# Patient Record
Sex: Male | Born: 1975 | Race: White | Hispanic: No | Smoking: Current some day smoker
Health system: Southern US, Community
[De-identification: ages and names within clinical notes are randomized; demographics above are authoritative.]

## PROBLEM LIST (undated history)

## (undated) DIAGNOSIS — F988 Other specified behavioral and emotional disorders with onset usually occurring in childhood and adolescence: Secondary | ICD-10-CM

## (undated) DIAGNOSIS — J45909 Unspecified asthma, uncomplicated: Secondary | ICD-10-CM

## (undated) HISTORY — DX: Other specified behavioral and emotional disorders with onset usually occurring in childhood and adolescence: F98.8

## (undated) HISTORY — DX: Unspecified asthma, uncomplicated: J45.909

---

## 2015-12-18 ENCOUNTER — Encounter (HOSPITAL_COMMUNITY): Payer: Self-pay | Admitting: Radiology

## 2015-12-18 ENCOUNTER — Emergency Department (HOSPITAL_COMMUNITY): Payer: Medicaid Other

## 2015-12-18 ENCOUNTER — Inpatient Hospital Stay (HOSPITAL_COMMUNITY)
Admission: EM | Admit: 2015-12-18 | Discharge: 2015-12-23 | DRG: 493 | Disposition: A | Discharge status: Rehabilitation Facility | Payer: Medicaid Other | Attending: Orthopedic Surgery | Admitting: Orthopedic Surgery

## 2015-12-18 DIAGNOSIS — S0101XA Laceration without foreign body of scalp, initial encounter: Secondary | ICD-10-CM | POA: Diagnosis present

## 2015-12-18 DIAGNOSIS — S0240DA Maxillary fracture, left side, initial encounter for closed fracture: Secondary | ICD-10-CM | POA: Diagnosis present

## 2015-12-18 DIAGNOSIS — D696 Thrombocytopenia, unspecified: Secondary | ICD-10-CM | POA: Diagnosis not present

## 2015-12-18 DIAGNOSIS — S82891B Other fracture of right lower leg, initial encounter for open fracture type I or II: Secondary | ICD-10-CM | POA: Diagnosis present

## 2015-12-18 DIAGNOSIS — S329XXA Fracture of unspecified parts of lumbosacral spine and pelvis, initial encounter for closed fracture: Secondary | ICD-10-CM | POA: Diagnosis present

## 2015-12-18 DIAGNOSIS — R339 Retention of urine, unspecified: Secondary | ICD-10-CM | POA: Diagnosis not present

## 2015-12-18 DIAGNOSIS — R062 Wheezing: Secondary | ICD-10-CM | POA: Diagnosis not present

## 2015-12-18 DIAGNOSIS — S82841B Displaced bimalleolar fracture of right lower leg, initial encounter for open fracture type I or II: Principal | ICD-10-CM

## 2015-12-18 DIAGNOSIS — S3219XA Other fracture of sacrum, initial encounter for closed fracture: Secondary | ICD-10-CM | POA: Diagnosis present

## 2015-12-18 DIAGNOSIS — M6281 Muscle weakness (generalized): Secondary | ICD-10-CM

## 2015-12-18 DIAGNOSIS — Z419 Encounter for procedure for purposes other than remedying health state, unspecified: Secondary | ICD-10-CM

## 2015-12-18 DIAGNOSIS — E876 Hypokalemia: Secondary | ICD-10-CM | POA: Diagnosis present

## 2015-12-18 DIAGNOSIS — R000 Tachycardia, unspecified: Secondary | ICD-10-CM | POA: Diagnosis present

## 2015-12-18 DIAGNOSIS — S32811A Multiple fractures of pelvis with unstable disruption of pelvic ring, initial encounter for closed fracture: Secondary | ICD-10-CM | POA: Diagnosis present

## 2015-12-18 DIAGNOSIS — E871 Hypo-osmolality and hyponatremia: Secondary | ICD-10-CM | POA: Diagnosis present

## 2015-12-18 DIAGNOSIS — S2242XA Multiple fractures of ribs, left side, initial encounter for closed fracture: Secondary | ICD-10-CM | POA: Diagnosis present

## 2015-12-18 DIAGNOSIS — F17200 Nicotine dependence, unspecified, uncomplicated: Secondary | ICD-10-CM | POA: Diagnosis present

## 2015-12-18 DIAGNOSIS — K5900 Constipation, unspecified: Secondary | ICD-10-CM | POA: Diagnosis not present

## 2015-12-18 DIAGNOSIS — Z720 Tobacco use: Secondary | ICD-10-CM | POA: Diagnosis present

## 2015-12-18 DIAGNOSIS — T1490XA Injury, unspecified, initial encounter: Secondary | ICD-10-CM

## 2015-12-18 DIAGNOSIS — D72829 Elevated white blood cell count, unspecified: Secondary | ICD-10-CM | POA: Diagnosis present

## 2015-12-18 DIAGNOSIS — R52 Pain, unspecified: Secondary | ICD-10-CM

## 2015-12-18 DIAGNOSIS — S32810A Multiple fractures of pelvis with stable disruption of pelvic ring, initial encounter for closed fracture: Secondary | ICD-10-CM

## 2015-12-18 DIAGNOSIS — D62 Acute posthemorrhagic anemia: Secondary | ICD-10-CM | POA: Diagnosis not present

## 2015-12-18 LAB — COMPREHENSIVE METABOLIC PANEL
ALK PHOS: 55 U/L (ref 38–126)
ALT: 38 U/L (ref 17–63)
ANION GAP: 6 (ref 5–15)
AST: 36 U/L (ref 15–41)
Albumin: 3.1 g/dL — ABNORMAL LOW (ref 3.5–5.0)
BILIRUBIN TOTAL: 0.4 mg/dL (ref 0.3–1.2)
BUN: 11 mg/dL (ref 6–20)
CALCIUM: 7.8 mg/dL — AB (ref 8.9–10.3)
CO2: 22 mmol/L (ref 22–32)
Chloride: 111 mmol/L (ref 101–111)
Creatinine, Ser: 0.89 mg/dL (ref 0.61–1.24)
Glucose, Bld: 97 mg/dL (ref 65–99)
POTASSIUM: 3.5 mmol/L (ref 3.5–5.1)
Sodium: 139 mmol/L (ref 135–145)
TOTAL PROTEIN: 5.1 g/dL — AB (ref 6.5–8.1)

## 2015-12-18 LAB — CBC
HCT: 36.3 % — ABNORMAL LOW (ref 39.0–52.0)
HEMOGLOBIN: 12.1 g/dL — AB (ref 13.0–17.0)
MCH: 30.7 pg (ref 26.0–34.0)
MCHC: 33.3 g/dL (ref 30.0–36.0)
MCV: 92.1 fL (ref 78.0–100.0)
Platelets: 247 10*3/uL (ref 150–400)
RBC: 3.94 MIL/uL — AB (ref 4.22–5.81)
RDW: 13.3 % (ref 11.5–15.5)
WBC: 25.6 10*3/uL — AB (ref 4.0–10.5)

## 2015-12-18 LAB — CDS SEROLOGY

## 2015-12-18 LAB — I-STAT CHEM 8, ED
BUN: 11 mg/dL (ref 6–20)
CALCIUM ION: 1.11 mmol/L — AB (ref 1.15–1.40)
CHLORIDE: 107 mmol/L (ref 101–111)
Creatinine, Ser: 1 mg/dL (ref 0.61–1.24)
GLUCOSE: 96 mg/dL (ref 65–99)
HCT: 35 % — ABNORMAL LOW (ref 39.0–52.0)
Hemoglobin: 11.9 g/dL — ABNORMAL LOW (ref 13.0–17.0)
Potassium: 3.3 mmol/L — ABNORMAL LOW (ref 3.5–5.1)
Sodium: 143 mmol/L (ref 135–145)
TCO2: 23 mmol/L (ref 0–100)

## 2015-12-18 LAB — PREPARE FRESH FROZEN PLASMA
UNIT DIVISION: 0
UNIT DIVISION: 0

## 2015-12-18 LAB — PROTIME-INR
INR: 1.1
PROTHROMBIN TIME: 14.3 s (ref 11.4–15.2)

## 2015-12-18 LAB — ETHANOL

## 2015-12-18 LAB — I-STAT CG4 LACTIC ACID, ED: LACTIC ACID, VENOUS: 2.1 mmol/L — AB (ref 0.5–1.9)

## 2015-12-18 LAB — ABO/RH: ABO/RH(D): A POS

## 2015-12-18 MED ORDER — HYDROMORPHONE HCL 1 MG/ML IJ SOLN
1.0000 mg | Freq: Once | INTRAMUSCULAR | Status: AC
  Administered 2015-12-18: 1 mg via INTRAVENOUS
  Filled 2015-12-18: qty 1

## 2015-12-18 MED ORDER — FENTANYL CITRATE (PF) 100 MCG/2ML IJ SOLN
INTRAMUSCULAR | Status: AC
  Filled 2015-12-18: qty 2

## 2015-12-18 MED ORDER — IOPAMIDOL (ISOVUE-300) INJECTION 61%
100.0000 mL | Freq: Once | INTRAVENOUS | Status: AC | PRN
  Administered 2015-12-18: 100 mL via INTRAVENOUS

## 2015-12-18 MED ORDER — DEXTROSE 5 % IV SOLN
INTRAVENOUS | Status: AC | PRN
  Administered 2015-12-18: 2000 mg via INTRAVENOUS

## 2015-12-18 MED ORDER — KETAMINE HCL 10 MG/ML IJ SOLN
0.5000 mg/kg | Freq: Once | INTRAMUSCULAR | Status: DC
  Administered 2015-12-18: 50 mg via INTRAVENOUS

## 2015-12-18 MED ORDER — TETANUS-DIPHTH-ACELL PERTUSSIS 5-2.5-18.5 LF-MCG/0.5 IM SUSP
0.5000 mL | Freq: Once | INTRAMUSCULAR | Status: AC
  Administered 2015-12-18: 0.5 mL via INTRAMUSCULAR

## 2015-12-18 MED ORDER — FENTANYL CITRATE (PF) 100 MCG/2ML IJ SOLN
100.0000 ug | Freq: Once | INTRAMUSCULAR | Status: AC
  Administered 2015-12-18: 100 ug via INTRAVENOUS

## 2015-12-18 MED ORDER — HYDROMORPHONE HCL 1 MG/ML IJ SOLN: 1.0000 mg | Freq: Once | INTRAMUSCULAR | Status: DC

## 2015-12-18 MED ORDER — TETANUS-DIPHTH-ACELL PERTUSSIS 5-2.5-18.5 LF-MCG/0.5 IM SUSP
INTRAMUSCULAR | Status: AC
  Filled 2015-12-18: qty 0.5

## 2015-12-18 MED ORDER — SODIUM CHLORIDE 0.9 % IV SOLN
INTRAVENOUS | Status: AC | PRN
  Administered 2015-12-18 (×3): 1000 mL via INTRAVENOUS

## 2015-12-18 MED ORDER — CEFAZOLIN SODIUM-DEXTROSE 2-4 GM/100ML-% IV SOLN
INTRAVENOUS | Status: AC
  Filled 2015-12-18: qty 100

## 2015-12-18 MED ORDER — ONDANSETRON HCL 4 MG/2ML IJ SOLN
4.0000 mg | Freq: Once | INTRAMUSCULAR | Status: AC
  Administered 2015-12-18: 4 mg via INTRAVENOUS
  Filled 2015-12-18: qty 2

## 2015-12-18 MED ORDER — FENTANYL CITRATE (PF) 100 MCG/2ML IJ SOLN
INTRAMUSCULAR | Status: AC | PRN
  Administered 2015-12-18: 100 ug via INTRAVENOUS

## 2015-12-18 MED ORDER — CEFAZOLIN SODIUM-DEXTROSE 2-4 GM/100ML-% IV SOLN
2.0000 g | Freq: Once | INTRAVENOUS | Status: AC
  Administered 2015-12-20: 2 g via INTRAVENOUS

## 2015-12-18 MED ORDER — KETAMINE HCL-SODIUM CHLORIDE 100-0.9 MG/10ML-% IV SOSY
PREFILLED_SYRINGE | INTRAVENOUS | Status: AC
  Filled 2015-12-18: qty 10

## 2015-12-18 NOTE — Progress Notes (Signed)
Orthopedic Tech Progress Note Patient Details:  Allyson SabalDavid Housey 1976/01/16 [id number]  Ortho Devices Type of Ortho Device: Ace wrap, Post (short leg) splint, Stirrup splint Ortho Device/Splint Location: RLE Ortho Device/Splint Interventions: Ordered, Application   [name], Brinkley Peet Craig 12/18/2015, 9:51 PM

## 2015-12-18 NOTE — Progress Notes (Signed)
Orthopedic Tech Progress Note Patient Details:  Lance SabalDavid Rivera July 10, 1975 [id number] Level 1 trauma ortho visit. Patient ID: Lance Rivera, male   DOB: July 10, 1975, 40 y.o.   MRN: [id number]   Lance Rivera, Lance Rivera 12/18/2015, 8:02 PM
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
## 2015-12-18 NOTE — ED Notes (Signed)
Ancef complete
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
## 2015-12-19 ENCOUNTER — Encounter (HOSPITAL_COMMUNITY): Payer: Self-pay

## 2015-12-19 ENCOUNTER — Other Ambulatory Visit (HOSPITAL_COMMUNITY): Payer: Self-pay | Admitting: Family

## 2015-12-19 DIAGNOSIS — S3219XA Other fracture of sacrum, initial encounter for closed fracture: Secondary | ICD-10-CM | POA: Diagnosis present

## 2015-12-19 DIAGNOSIS — F17200 Nicotine dependence, unspecified, uncomplicated: Secondary | ICD-10-CM | POA: Diagnosis present

## 2015-12-19 DIAGNOSIS — S32811D Multiple fractures of pelvis with unstable disruption of pelvic ring, subsequent encounter for fracture with routine healing: Secondary | ICD-10-CM | POA: Diagnosis not present

## 2015-12-19 DIAGNOSIS — D72829 Elevated white blood cell count, unspecified: Secondary | ICD-10-CM | POA: Diagnosis present

## 2015-12-19 DIAGNOSIS — S82891B Other fracture of right lower leg, initial encounter for open fracture type I or II: Secondary | ICD-10-CM | POA: Diagnosis present

## 2015-12-19 DIAGNOSIS — S82892D Other fracture of left lower leg, subsequent encounter for closed fracture with routine healing: Secondary | ICD-10-CM | POA: Diagnosis not present

## 2015-12-19 DIAGNOSIS — S82892S Other fracture of left lower leg, sequela: Secondary | ICD-10-CM | POA: Diagnosis not present

## 2015-12-19 DIAGNOSIS — R339 Retention of urine, unspecified: Secondary | ICD-10-CM | POA: Diagnosis not present

## 2015-12-19 DIAGNOSIS — S2242XA Multiple fractures of ribs, left side, initial encounter for closed fracture: Secondary | ICD-10-CM | POA: Diagnosis present

## 2015-12-19 DIAGNOSIS — E871 Hypo-osmolality and hyponatremia: Secondary | ICD-10-CM | POA: Diagnosis present

## 2015-12-19 DIAGNOSIS — S329XXA Fracture of unspecified parts of lumbosacral spine and pelvis, initial encounter for closed fracture: Secondary | ICD-10-CM | POA: Diagnosis present

## 2015-12-19 DIAGNOSIS — K5900 Constipation, unspecified: Secondary | ICD-10-CM | POA: Diagnosis not present

## 2015-12-19 DIAGNOSIS — S82891S Other fracture of right lower leg, sequela: Secondary | ICD-10-CM | POA: Diagnosis not present

## 2015-12-19 DIAGNOSIS — R062 Wheezing: Secondary | ICD-10-CM | POA: Diagnosis not present

## 2015-12-19 DIAGNOSIS — S32811A Multiple fractures of pelvis with unstable disruption of pelvic ring, initial encounter for closed fracture: Secondary | ICD-10-CM | POA: Diagnosis present

## 2015-12-19 DIAGNOSIS — S82891D Other fracture of right lower leg, subsequent encounter for closed fracture with routine healing: Secondary | ICD-10-CM | POA: Diagnosis not present

## 2015-12-19 DIAGNOSIS — D62 Acute posthemorrhagic anemia: Secondary | ICD-10-CM | POA: Diagnosis not present

## 2015-12-19 DIAGNOSIS — S32810S Multiple fractures of pelvis with stable disruption of pelvic ring, sequela: Secondary | ICD-10-CM | POA: Diagnosis not present

## 2015-12-19 DIAGNOSIS — S32810D Multiple fractures of pelvis with stable disruption of pelvic ring, subsequent encounter for fracture with routine healing: Secondary | ICD-10-CM | POA: Diagnosis not present

## 2015-12-19 DIAGNOSIS — S0101XA Laceration without foreign body of scalp, initial encounter: Secondary | ICD-10-CM | POA: Diagnosis present

## 2015-12-19 DIAGNOSIS — S32810A Multiple fractures of pelvis with stable disruption of pelvic ring, initial encounter for closed fracture: Secondary | ICD-10-CM | POA: Diagnosis not present

## 2015-12-19 DIAGNOSIS — M25571 Pain in right ankle and joints of right foot: Secondary | ICD-10-CM | POA: Diagnosis present

## 2015-12-19 DIAGNOSIS — E876 Hypokalemia: Secondary | ICD-10-CM | POA: Diagnosis present

## 2015-12-19 DIAGNOSIS — Z419 Encounter for procedure for purposes other than remedying health state, unspecified: Secondary | ICD-10-CM | POA: Diagnosis not present

## 2015-12-19 DIAGNOSIS — S82841B Displaced bimalleolar fracture of right lower leg, initial encounter for open fracture type I or II: Secondary | ICD-10-CM | POA: Diagnosis present

## 2015-12-19 DIAGNOSIS — S0240DA Maxillary fracture, left side, initial encounter for closed fracture: Secondary | ICD-10-CM | POA: Diagnosis present

## 2015-12-19 LAB — BASIC METABOLIC PANEL
Anion gap: 5 (ref 5–15)
BUN: 13 mg/dL (ref 6–20)
CALCIUM: 7.9 mg/dL — AB (ref 8.9–10.3)
CO2: 24 mmol/L (ref 22–32)
CREATININE: 1.06 mg/dL (ref 0.61–1.24)
Chloride: 112 mmol/L — ABNORMAL HIGH (ref 101–111)
Glucose, Bld: 149 mg/dL — ABNORMAL HIGH (ref 65–99)
Potassium: 4.1 mmol/L (ref 3.5–5.1)
SODIUM: 141 mmol/L (ref 135–145)

## 2015-12-19 LAB — CBC
HCT: 33.8 % — ABNORMAL LOW (ref 39.0–52.0)
HEMOGLOBIN: 10.9 g/dL — AB (ref 13.0–17.0)
MCH: 29.9 pg (ref 26.0–34.0)
MCHC: 32.2 g/dL (ref 30.0–36.0)
MCV: 92.9 fL (ref 78.0–100.0)
Platelets: 217 10*3/uL (ref 150–400)
RBC: 3.64 MIL/uL — ABNORMAL LOW (ref 4.22–5.81)
RDW: 13.3 % (ref 11.5–15.5)
WBC: 14.4 10*3/uL — ABNORMAL HIGH (ref 4.0–10.5)

## 2015-12-19 LAB — URINALYSIS, ROUTINE W REFLEX MICROSCOPIC
Bilirubin Urine: NEGATIVE
GLUCOSE, UA: NEGATIVE mg/dL
HGB URINE DIPSTICK: NEGATIVE
Ketones, ur: NEGATIVE mg/dL
LEUKOCYTES UA: NEGATIVE
Nitrite: NEGATIVE
PH: 5 (ref 5.0–8.0)
PROTEIN: NEGATIVE mg/dL
SPECIFIC GRAVITY, URINE: 1.02 (ref 1.005–1.030)

## 2015-12-19 LAB — BLOOD PRODUCT ORDER (VERBAL) VERIFICATION

## 2015-12-19 LAB — MRSA PCR SCREENING: MRSA BY PCR: POSITIVE — AB

## 2015-12-19 LAB — GLUCOSE, CAPILLARY: Glucose-Capillary: 145 mg/dL — ABNORMAL HIGH (ref 65–99)

## 2015-12-19 MED ORDER — PNEUMOCOCCAL VAC POLYVALENT 25 MCG/0.5ML IJ INJ: 0.5000 mL | INJECTION | INTRAMUSCULAR | Status: DC

## 2015-12-19 MED ORDER — CEFAZOLIN IN D5W 1 GM/50ML IV SOLN
1.0000 g | Freq: Three times a day (TID) | INTRAVENOUS | Status: DC
Start: 2015-12-19 — End: 2015-12-22
  Administered 2015-12-19 – 2015-12-22 (×9): 1 g via INTRAVENOUS
  Filled 2015-12-19 (×14): qty 50

## 2015-12-19 MED ORDER — HYDROMORPHONE 1 MG/ML IV SOLN
INTRAVENOUS | Status: DC
  Administered 2015-12-19: 3.2 mg via INTRAVENOUS
  Administered 2015-12-19: 13:00:00 via INTRAVENOUS
  Administered 2015-12-19: 1.5 mg via INTRAVENOUS
  Administered 2015-12-19: 2.4 mg via INTRAVENOUS
  Administered 2015-12-20: 0.6 mg via INTRAVENOUS
  Administered 2015-12-20: 2.4 mg via INTRAVENOUS
  Administered 2015-12-20: 2.1 mg via INTRAVENOUS
  Administered 2015-12-20: 3.3 mg via INTRAVENOUS
  Administered 2015-12-21: 1.2 mg via INTRAVENOUS
  Administered 2015-12-21: 0.3 mg via INTRAVENOUS
  Administered 2015-12-21: 3.3 mg via INTRAVENOUS
  Administered 2015-12-21: 4.5 mg via INTRAVENOUS
  Administered 2015-12-21: 11:00:00 via INTRAVENOUS
  Administered 2015-12-21: 1.8 mg via INTRAVENOUS
  Administered 2015-12-21 – 2015-12-22 (×3): 3 mg via INTRAVENOUS
  Administered 2015-12-22: 10 mg via INTRAVENOUS
  Filled 2015-12-19 (×2): qty 25

## 2015-12-19 MED ORDER — SODIUM CHLORIDE 0.9% FLUSH: 9.0000 mL | INTRAVENOUS | Status: DC | PRN

## 2015-12-19 MED ORDER — KCL IN DEXTROSE-NACL 20-5-0.45 MEQ/L-%-% IV SOLN
INTRAVENOUS | Status: DC
  Administered 2015-12-19 – 2015-12-20 (×2): via INTRAVENOUS
  Filled 2015-12-19 (×6): qty 1000

## 2015-12-19 MED ORDER — DOCUSATE SODIUM 100 MG PO CAPS
100.0000 mg | ORAL_CAPSULE | Freq: Two times a day (BID) | ORAL | Status: DC
  Administered 2015-12-19 – 2015-12-23 (×9): 100 mg via ORAL
  Filled 2015-12-19 (×9): qty 1

## 2015-12-19 MED ORDER — ACETAMINOPHEN 325 MG PO TABS
650.0000 mg | ORAL_TABLET | ORAL | Status: DC | PRN
  Administered 2015-12-19 – 2015-12-21 (×2): 650 mg via ORAL
  Filled 2015-12-19 (×2): qty 2

## 2015-12-19 MED ORDER — MORPHINE SULFATE (PF) 2 MG/ML IV SOLN
2.0000 mg | INTRAVENOUS | Status: DC | PRN
  Administered 2015-12-19 (×5): 4 mg via INTRAVENOUS
  Administered 2015-12-19: 2 mg via INTRAVENOUS
  Administered 2015-12-19: 4 mg via INTRAVENOUS
  Filled 2015-12-19: qty 2
  Filled 2015-12-19: qty 1
  Filled 2015-12-19 (×5): qty 2

## 2015-12-19 MED ORDER — NALOXONE HCL 0.4 MG/ML IJ SOLN: 0.4000 mg | INTRAMUSCULAR | Status: DC | PRN

## 2015-12-19 MED ORDER — DIPHENHYDRAMINE HCL 50 MG/ML IJ SOLN
12.5000 mg | Freq: Four times a day (QID) | INTRAMUSCULAR | Status: DC | PRN
  Administered 2015-12-19 – 2015-12-20 (×4): 12.5 mg via INTRAVENOUS
  Filled 2015-12-19 (×4): qty 1

## 2015-12-19 MED ORDER — ONDANSETRON HCL 4 MG/2ML IJ SOLN: 4.0000 mg | Freq: Four times a day (QID) | INTRAMUSCULAR | Status: DC | PRN

## 2015-12-19 MED ORDER — MUPIROCIN 2 % EX OINT
1.0000 "application " | TOPICAL_OINTMENT | Freq: Two times a day (BID) | CUTANEOUS | Status: DC
  Administered 2015-12-19 – 2015-12-23 (×9): 1 via NASAL
  Filled 2015-12-19 (×2): qty 22

## 2015-12-19 MED ORDER — DIPHENHYDRAMINE HCL 12.5 MG/5ML PO ELIX: 12.5000 mg | ORAL_SOLUTION | Freq: Four times a day (QID) | ORAL | Status: DC | PRN

## 2015-12-19 MED ORDER — ONDANSETRON HCL 4 MG PO TABS: 4.0000 mg | ORAL_TABLET | Freq: Four times a day (QID) | ORAL | Status: DC | PRN

## 2015-12-19 MED ORDER — CHLORHEXIDINE GLUCONATE CLOTH 2 % EX PADS
6.0000 | MEDICATED_PAD | Freq: Every day | CUTANEOUS | Status: AC
  Administered 2015-12-20 – 2015-12-23 (×4): 6 via TOPICAL

## 2015-12-19 MED ORDER — ORAL CARE MOUTH RINSE
15.0000 mL | Freq: Two times a day (BID) | OROMUCOSAL | Status: DC
  Administered 2015-12-19 – 2015-12-22 (×7): 15 mL via OROMUCOSAL

## 2015-12-19 MED ORDER — IPRATROPIUM-ALBUTEROL 0.5-2.5 (3) MG/3ML IN SOLN
3.0000 mL | Freq: Four times a day (QID) | RESPIRATORY_TRACT | Status: DC
  Administered 2015-12-19 – 2015-12-20 (×5): 3 mL via RESPIRATORY_TRACT
  Filled 2015-12-19 (×5): qty 3

## 2015-12-19 NOTE — Progress Notes (Signed)
Family advised that unit is not responsible for valuables left in the room, family understands risk of leaving cell phone.
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
## 2015-12-19 NOTE — Progress Notes (Signed)
Patient ID: Lance BurnsAllyson Rivera, male   DOB: 24-Mar-1976, 40 y.o.   MRN: [id number] Patient alert and oriented this morning without new complaints. Plan for open reduction internal fixation right ankle fracture on Tuesday. Orders written to continue IV Kefzol.

## 2015-12-20 ENCOUNTER — Inpatient Hospital Stay (HOSPITAL_COMMUNITY): Payer: Medicaid Other

## 2015-12-20 ENCOUNTER — Inpatient Hospital Stay (HOSPITAL_COMMUNITY): Payer: Medicaid Other | Admitting: Certified Registered"

## 2015-12-20 ENCOUNTER — Encounter (HOSPITAL_COMMUNITY): Admission: EM | Disposition: A | Discharge status: Rehabilitation Facility | Payer: Self-pay | Source: Home / Self Care

## 2015-12-20 HISTORY — PX: SACRO-ILIAC PINNING: SHX5050

## 2015-12-20 HISTORY — PX: ORIF ANKLE FRACTURE: SHX5408

## 2015-12-20 SURGERY — OPEN REDUCTION INTERNAL FIXATION (ORIF) ANKLE FRACTURE
Anesthesia: Regional | Site: Pelvis | Laterality: Right

## 2015-12-20 MED ORDER — HYDROMORPHONE HCL 1 MG/ML IJ SOLN: 0.2500 mg | INTRAMUSCULAR | Status: DC | PRN

## 2015-12-20 MED ORDER — ROCURONIUM BROMIDE 10 MG/ML (PF) SYRINGE
PREFILLED_SYRINGE | INTRAVENOUS | Status: AC
  Filled 2015-12-20: qty 10

## 2015-12-20 MED ORDER — FENTANYL CITRATE (PF) 100 MCG/2ML IJ SOLN
INTRAMUSCULAR | Status: AC
  Filled 2015-12-20: qty 2

## 2015-12-20 MED ORDER — MIDAZOLAM HCL 5 MG/5ML IJ SOLN
INTRAMUSCULAR | Status: DC | PRN
  Administered 2015-12-20: 2 mg via INTRAVENOUS

## 2015-12-20 MED ORDER — MIDAZOLAM HCL 2 MG/2ML IJ SOLN
INTRAMUSCULAR | Status: AC
  Filled 2015-12-20: qty 2

## 2015-12-20 MED ORDER — LIDOCAINE 2% (20 MG/ML) 5 ML SYRINGE
INTRAMUSCULAR | Status: AC
  Filled 2015-12-20: qty 5

## 2015-12-20 MED ORDER — 0.9 % SODIUM CHLORIDE (POUR BTL) OPTIME
TOPICAL | Status: DC | PRN
  Administered 2015-12-20: 1000 mL

## 2015-12-20 MED ORDER — PNEUMOCOCCAL VAC POLYVALENT 25 MCG/0.5ML IJ INJ
0.5000 mL | INJECTION | INTRAMUSCULAR | Status: AC
  Administered 2015-12-21: 0.5 mL via INTRAMUSCULAR
  Filled 2015-12-20: qty 0.5

## 2015-12-20 MED ORDER — FENTANYL CITRATE (PF) 100 MCG/2ML IJ SOLN
INTRAMUSCULAR | Status: DC | PRN
  Administered 2015-12-20 (×4): 50 ug via INTRAVENOUS

## 2015-12-20 MED ORDER — SUGAMMADEX SODIUM 200 MG/2ML IV SOLN
INTRAVENOUS | Status: AC
  Filled 2015-12-20: qty 2

## 2015-12-20 MED ORDER — LACTATED RINGERS IV SOLN
INTRAVENOUS | Status: DC
  Administered 2015-12-20 (×3): via INTRAVENOUS

## 2015-12-20 MED ORDER — CEFAZOLIN SODIUM-DEXTROSE 2-4 GM/100ML-% IV SOLN
2.0000 g | INTRAVENOUS | Status: DC
  Filled 2015-12-20 (×2): qty 100

## 2015-12-20 MED ORDER — LIDOCAINE HCL (CARDIAC) 20 MG/ML IV SOLN
INTRAVENOUS | Status: DC | PRN
  Administered 2015-12-20: 100 mg via INTRAVENOUS

## 2015-12-20 MED ORDER — PROPOFOL 10 MG/ML IV BOLUS
INTRAVENOUS | Status: DC | PRN
  Administered 2015-12-20: 150 mg via INTRAVENOUS

## 2015-12-20 MED ORDER — IPRATROPIUM-ALBUTEROL 0.5-2.5 (3) MG/3ML IN SOLN: 3.0000 mL | Freq: Four times a day (QID) | RESPIRATORY_TRACT | Status: DC | PRN

## 2015-12-20 MED ORDER — ONDANSETRON HCL 4 MG/2ML IJ SOLN
INTRAMUSCULAR | Status: AC
  Filled 2015-12-20: qty 2

## 2015-12-20 MED ORDER — PROMETHAZINE HCL 25 MG/ML IJ SOLN: 6.2500 mg | INTRAMUSCULAR | Status: DC | PRN

## 2015-12-20 MED ORDER — ROCURONIUM BROMIDE 100 MG/10ML IV SOLN
INTRAVENOUS | Status: DC | PRN
  Administered 2015-12-20: 20 mg via INTRAVENOUS
  Administered 2015-12-20: 10 mg via INTRAVENOUS
  Administered 2015-12-20: 50 mg via INTRAVENOUS
  Administered 2015-12-20 (×2): 20 mg via INTRAVENOUS

## 2015-12-20 MED ORDER — ONDANSETRON HCL 4 MG/2ML IJ SOLN
INTRAMUSCULAR | Status: DC | PRN
  Administered 2015-12-20: 4 mg via INTRAVENOUS

## 2015-12-20 MED ORDER — SUGAMMADEX SODIUM 200 MG/2ML IV SOLN
INTRAVENOUS | Status: DC | PRN
  Administered 2015-12-20: 200 mg via INTRAVENOUS

## 2015-12-20 MED ORDER — CHLORHEXIDINE GLUCONATE 4 % EX LIQD
60.0000 mL | Freq: Once | CUTANEOUS | Status: AC
  Administered 2015-12-20: 4 via TOPICAL
  Filled 2015-12-20: qty 60

## 2015-12-20 SURGICAL SUPPLY — 47 items
2 screw short thread ×8 IMPLANT
BANDAGE ESMARK 6X9 LF (GAUZE/BANDAGES/DRESSINGS) ×2 IMPLANT
BIT DRILL LCP QC 2X140 (BIT) ×4 IMPLANT
BNDG COHESIVE 4X5 TAN STRL (GAUZE/BANDAGES/DRESSINGS) ×4 IMPLANT
BNDG ESMARK 6X9 LF (GAUZE/BANDAGES/DRESSINGS) ×4
BNDG GAUZE ELAST 4 BULKY (GAUZE/BANDAGES/DRESSINGS) ×4 IMPLANT
COVER SURGICAL LIGHT HANDLE (MISCELLANEOUS) ×4 IMPLANT
CUFF TOURNIQUET SINGLE 34IN LL (TOURNIQUET CUFF) IMPLANT
CUFF TOURNIQUET SINGLE 44IN (TOURNIQUET CUFF) IMPLANT
DRAPE INCISE IOBAN 66X45 STRL (DRAPES) IMPLANT
DRAPE OEC MINIVIEW 54X84 (DRAPES) IMPLANT
DRAPE PROXIMA HALF (DRAPES) ×4 IMPLANT
DRAPE U-SHAPE 47X51 STRL (DRAPES) ×4 IMPLANT
DRSG ADAPTIC 3X8 NADH LF (GAUZE/BANDAGES/DRESSINGS) ×4 IMPLANT
DRSG PAD ABDOMINAL 8X10 ST (GAUZE/BANDAGES/DRESSINGS) ×8 IMPLANT
DURAPREP 26ML APPLICATOR (WOUND CARE) ×4 IMPLANT
ELECT REM PT RETURN 9FT ADLT (ELECTROSURGICAL) ×4
ELECTRODE REM PT RTRN 9FT ADLT (ELECTROSURGICAL) ×2 IMPLANT
GAUZE SPONGE 4X4 12PLY STRL (GAUZE/BANDAGES/DRESSINGS) ×4 IMPLANT
GLOVE BIOGEL PI IND STRL 9 (GLOVE) ×2 IMPLANT
GLOVE BIOGEL PI INDICATOR 9 (GLOVE) ×2
GLOVE SURG ORTHO 9.0 STRL STRW (GLOVE) ×4 IMPLANT
GOWN STRL REUS W/ TWL XL LVL3 (GOWN DISPOSABLE) ×6 IMPLANT
GOWN STRL REUS W/TWL XL LVL3 (GOWN DISPOSABLE) ×6
KIT BASIN OR (CUSTOM PROCEDURE TRAY) ×4 IMPLANT
KIT ROOM TURNOVER OR (KITS) ×4 IMPLANT
MANIFOLD NEPTUNE II (INSTRUMENTS) ×4 IMPLANT
NS IRRIG 1000ML POUR BTL (IV SOLUTION) ×4 IMPLANT
PACK GENERAL/GYN (CUSTOM PROCEDURE TRAY) ×4 IMPLANT
PACK ORTHO EXTREMITY (CUSTOM PROCEDURE TRAY) IMPLANT
PAD ARMBOARD 7.5X6 YLW CONV (MISCELLANEOUS) ×8 IMPLANT
PLATE FIBULA 5 HOLE RIGHT (Plate) ×4 IMPLANT
SCREW CORT LP ST 3.5X14 (Screw) ×4 IMPLANT
SCREW LOCK VA ST 2.7X14 (Screw) ×4 IMPLANT
SCREW LOCKING 2.7X16MM VA (Screw) ×8 IMPLANT
SCREW NON LOCK 2.7X16MM (Screw) ×4 IMPLANT
SPONGE LAP 18X18 X RAY DECT (DISPOSABLE) ×4 IMPLANT
STAPLER VISISTAT 35W (STAPLE) IMPLANT
SUCTION FRAZIER HANDLE 10FR (MISCELLANEOUS) ×2
SUCTION TUBE FRAZIER 10FR DISP (MISCELLANEOUS) ×2 IMPLANT
SUT ETHILON 2 0 PSLX (SUTURE) IMPLANT
SUT VIC AB 2-0 CT1 27 (SUTURE) ×4
SUT VIC AB 2-0 CT1 TAPERPNT 27 (SUTURE) ×4 IMPLANT
TOWEL OR 17X24 6PK STRL BLUE (TOWEL DISPOSABLE) ×4 IMPLANT
TOWEL OR 17X26 10 PK STRL BLUE (TOWEL DISPOSABLE) ×8 IMPLANT
TUBE CONNECTING 12'X1/4 (SUCTIONS) ×2
TUBE CONNECTING 12X1/4 (SUCTIONS) ×6 IMPLANT

## 2015-12-20 NOTE — Anesthesia Procedure Notes (Signed)
Procedure Name: Intubation Date/Time: 12/20/2015 1:05 PM Performed by: Lucinda DellECARLO, Sulma Ruffino M Pre-anesthesia Checklist: Patient identified, Emergency Drugs available, Suction available and Patient being monitored Patient Re-evaluated:Patient Re-evaluated prior to inductionOxygen Delivery Method: Circle system utilized Preoxygenation: Pre-oxygenation with 100% oxygen Intubation Type: IV induction Ventilation: Mask ventilation without difficulty Laryngoscope Size: Mac and 4 Grade View: Grade II Tube type: Oral Tube size: 7.5 mm Number of attempts: 1 Airway Equipment and Method: Stylet Placement Confirmation: ETT inserted through vocal cords under direct vision,  positive ETCO2 and breath sounds checked- equal and bilateral Secured at: 23 cm Tube secured with: Tape Dental Injury: Teeth and Oropharynx as per pre-operative assessment

## 2015-12-20 NOTE — Transfer of Care (Signed)
Immediate Anesthesia Transfer of Care Note  Patient: Lance Rivera  Procedure(s) Performed: Procedure(s): OPEN REDUCTION INTERNAL FIXATION (ORIF) OPEN ANKLE FRACTURE (Right)  Patient Location: PACU  Anesthesia Type:General  Level of Consciousness: sedated and responds to stimulation  Airway & Oxygen Therapy: Patient Spontanous Breathing and Patient connected to nasal cannula oxygen  Post-op Assessment: Report given to RN and Post -op Vital signs reviewed and stable  Post vital signs: Reviewed and stable  Last Vitals:  Vitals:   12/20/15 1146 12/20/15 1200  BP:    Pulse:  (!) 121  Resp: 19 13  Temp:      Last Pain:  Vitals:   12/20/15 1146  TempSrc:   PainSc: 4       Patients Stated Pain Goal: 3 (12/20/15 0858)  Complications: No apparent anesthesia complications

## 2015-12-20 NOTE — Progress Notes (Signed)
Orthopedic Tech Progress Note Patient Details:  Lance SabalDavid Rivera 06-24-1975 [id number]  Ortho Devices Type of Ortho Device: CAM walker Ortho Device/Splint Location: RLE Ortho Device/Splint Interventions: Ordered, Application   [name], [name] 12/20/2015, 5:52 PM

## 2015-12-20 NOTE — Anesthesia Postprocedure Evaluation (Signed)
Anesthesia Post Note  Patient: Lance Rivera  Procedure(s) Performed: Procedure(s) (LRB): OPEN REDUCTION INTERNAL FIXATION (ORIF) OPEN ANKLE FRACTURE (Right)  Patient location during evaluation: PACU Anesthesia Type: General Level of consciousness: awake and alert Pain management: pain level controlled Vital Signs Assessment: post-procedure vital signs reviewed and stable Respiratory status: spontaneous breathing, nonlabored ventilation, respiratory function stable and patient connected to nasal cannula oxygen Cardiovascular status: blood pressure returned to baseline and stable Postop Assessment: no signs of nausea or vomiting Anesthetic complications: no    Last Vitals:  Vitals:   12/20/15 1525 12/20/15 1530  BP: 130/77   Pulse: (!) 47   Resp: 19 15  Temp: 36.7 C     Last Pain:  Vitals:   12/20/15 1146  TempSrc:   PainSc: 4                  Lance Rivera Astrid DivineS Amyla Heffner
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
## 2015-12-20 NOTE — Interval H&P Note (Signed)
History and Physical Interval Note:  12/20/2015 6:22 AM  Lance Rivera  has presented today for surgery, with the diagnosis of Open Right Ankle Fracture  The various methods of treatment have been discussed with the patient and family. After consideration of risks, benefits and other options for treatment, the patient has consented to  Procedure(s): OPEN REDUCTION INTERNAL FIXATION (ORIF) OPEN ANKLE FRACTURE (Right) as a surgical intervention .  The patient's history has been reviewed, patient examined, no change in status, stable for surgery.  I have reviewed the patient's chart and labs.  Questions were answered to the patient's satisfaction.     Nadara MustardMarcus V Hector Taft

## 2015-12-20 NOTE — Op Note (Signed)
12/18/2015 - 12/20/2015  2:01 PM  PATIENT:  Lance Rivera    PRE-OPERATIVE DIAGNOSIS:  Open Right Ankle Fracture Weber B, Gustilo Anderson grade 1  POST-OPERATIVE DIAGNOSIS:  Same, plus segmental defect of the fibula approximately 1 cm  PROCEDURE:  OPEN REDUCTION INTERNAL FIXATION (ORIF) OPEN ANKLE FRACTURE #1 ORIF bimalleolar fracture. #2 local tissue rearrangement for wound closure. #3 irrigation and debridement of open fracture with irrigation and debridement with excision of skin soft tissue muscle and bone with debridement with a knife and Ronjair.  SURGEON:  Nadara MustardMarcus V Lord Lancour, MD  PHYSICIAN ASSISTANT:None ANESTHESIA:   General  PREOPERATIVE INDICATIONS:  Lance SabalDavid Menefee is a  40 y.o. male with a diagnosis of Open Right Ankle Fracture who failed conservative measures and elected for surgical management.    The risks benefits and alternatives were discussed with the patient preoperatively including but not limited to the risks of infection, bleeding, nerve injury, cardiopulmonary complications, the need for revision surgery, among others, and the patient was willing to proceed.  OPERATIVE IMPLANTS: Locking Synthes plate  OPERATIVE FINDINGS: No contamination but large segmental defect of the fibula  OPERATIVE PROCEDURE: Patient was brought to the operating room and underwent a general anesthetic. After adequate levels anesthesia obtained patient's right lower extremity was prepped using DuraPrep draped into a sterile field a timeout was called. An incision was made laterally over the fibula. This was carried down to bone and subperiosteal dissection was used to cleanse the fracture site. Patient had segmental fragments of the fibula and this was removed this left a defect approximately 1 cm. Due to the segmental defect a broad locking plate was used distally. The locking plate was secured to the distal fragment of the fibula and using the plate the distal end of the fibula was brought out to  length it was clamped proximally and then locked proximally with 3 compression screws. C-arm fluoroscopy verified restoration of the length of the fibula. The wound was irrigated with normal saline subcutaneous is closing 2-0 Vicryl skin was closed using staples. Attention was then focused over the medial malleolus. There is a small grade 1 lesion over the medial malleolus. This was extended up proximally and distally. The wound had no gross contamination. Soft tissue was excised from the wound and the wound was irrigated with normal saline including the joint. The medial malleolus was reduced and stabilized with 244 mm cannulated compression screws. C-arm fluoroscopy verified alignment reduction and a congruent mortise. The syndesmosis was intact. Local tissue rearrangement was used to perform wound closure of 7 x 3 cm. This was closed using 2-0 nylon. A sterile compressive dressing was applied.  Dr. Carola FrostHandy was present at the end of the surgery and patient was then prepped and draped for Dr. Carola FrostHandy surgery for pelvic stabilization. Patient's neurovascular exam was intact preoperatively. Plan for return to the ICU after Dr. Magdalene PatriciaHandy's surgery.

## 2015-12-20 NOTE — Progress Notes (Signed)
Patient ID: Lance SabalDavid Rivera, male   DOB: [birth year], 40 y.o.   MRN: [id number] Plan for open reduction internal fixation right ankle fracture this morning. I have consulted Dr. Carola FrostHandy for evaluation for left sacroiliac screw placement

## 2015-12-20 NOTE — H&P (View-Only) (Signed)
Patient ID: Lance Rivera, male   DOB: 05/03/1975, 40 y.o.   MRN: [id number] Patient alert and oriented this morning without new complaints. Plan for open reduction internal fixation right ankle fracture on Tuesday. Orders written to continue IV Kefzol. 

## 2015-12-20 NOTE — Anesthesia Preprocedure Evaluation (Addendum)
Anesthesia Evaluation  Patient identified by MRN, date of birth, ID band Patient awake    Reviewed: Allergy & Precautions, NPO status , Patient's Chart, lab work & pertinent test results  Airway Mallampati: II  TM Distance: >3 FB Neck ROM: Full    Dental no notable dental hx.    Pulmonary asthma , Current Smoker,    Pulmonary exam normal        Cardiovascular negative cardio ROS Normal cardiovascular exam     Neuro/Psych negative neurological ROS     GI/Hepatic negative GI ROS, Neg liver ROS,   Endo/Other  negative endocrine ROS  Renal/GU negative Renal ROS     Musculoskeletal negative musculoskeletal ROS (+)   Abdominal   Peds  Hematology negative hematology ROS (+)   Anesthesia Other Findings Day of surgery medications reviewed with the patient.  Reproductive/Obstetrics                            Anesthesia Physical Anesthesia Plan  ASA: I  Anesthesia Plan: General and Regional   Post-op Pain Management:  Regional for Post-op pain   Induction: Intravenous  Airway Management Planned: Oral ETT  Additional Equipment:   Intra-op Plan:   Post-operative Plan: Extubation in OR  Informed Consent: I have reviewed the patients History and Physical, chart, labs and discussed the procedure including the risks, benefits and alternatives for the proposed anesthesia with the patient or authorized representative who has indicated his/her understanding and acceptance.   Dental advisory given  Plan Discussed with: CRNA  Anesthesia Plan Comments:         Anesthesia Quick Evaluation
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
## 2015-12-20 NOTE — Progress Notes (Signed)
Heart rate in the 120's at times 130's Dr Armanda MagicGuidetti informed.
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
## 2015-12-21 ENCOUNTER — Inpatient Hospital Stay (HOSPITAL_COMMUNITY): Payer: Medicaid Other

## 2015-12-21 DIAGNOSIS — D62 Acute posthemorrhagic anemia: Secondary | ICD-10-CM | POA: Diagnosis not present

## 2015-12-21 DIAGNOSIS — S32811D Multiple fractures of pelvis with unstable disruption of pelvic ring, subsequent encounter for fracture with routine healing: Secondary | ICD-10-CM

## 2015-12-21 DIAGNOSIS — E871 Hypo-osmolality and hyponatremia: Secondary | ICD-10-CM

## 2015-12-21 DIAGNOSIS — R000 Tachycardia, unspecified: Secondary | ICD-10-CM | POA: Diagnosis present

## 2015-12-21 DIAGNOSIS — T1490 Injury, unspecified: Secondary | ICD-10-CM

## 2015-12-21 DIAGNOSIS — T1490XA Injury, unspecified, initial encounter: Secondary | ICD-10-CM

## 2015-12-21 DIAGNOSIS — D696 Thrombocytopenia, unspecified: Secondary | ICD-10-CM | POA: Diagnosis not present

## 2015-12-21 DIAGNOSIS — S32810A Multiple fractures of pelvis with stable disruption of pelvic ring, initial encounter for closed fracture: Secondary | ICD-10-CM | POA: Diagnosis present

## 2015-12-21 DIAGNOSIS — Z720 Tobacco use: Secondary | ICD-10-CM

## 2015-12-21 DIAGNOSIS — Z419 Encounter for procedure for purposes other than remedying health state, unspecified: Secondary | ICD-10-CM

## 2015-12-21 LAB — BASIC METABOLIC PANEL
Anion gap: 7 (ref 5–15)
Anion gap: 8 (ref 5–15)
BUN: 8 mg/dL (ref 6–20)
BUN: 8 mg/dL (ref 6–20)
CALCIUM: 7.5 mg/dL — AB (ref 8.9–10.3)
CHLORIDE: 97 mmol/L — AB (ref 101–111)
CO2: 28 mmol/L (ref 22–32)
CO2: 26 mmol/L (ref 22–32)
CREATININE: 0.63 mg/dL (ref 0.61–1.24)
Calcium: 7.7 mg/dL — ABNORMAL LOW (ref 8.9–10.3)
Chloride: 97 mmol/L — ABNORMAL LOW (ref 101–111)
Creatinine, Ser: 0.69 mg/dL (ref 0.61–1.24)
GFR calc Af Amer: >60 mL/min (ref >60)
GFR calc non Af Amer: >60 mL/min (ref >60)
Glucose, Bld: 117 mg/dL — ABNORMAL HIGH (ref 65–99)
Glucose, Bld: 125 mg/dL — ABNORMAL HIGH (ref 65–99)
POTASSIUM: 4.1 mmol/L (ref 3.5–5.1)
Potassium: 3.5 mmol/L (ref 3.5–5.1)
SODIUM: 132 mmol/L — AB (ref 135–145)
SODIUM: 131 mmol/L — AB (ref 135–145)

## 2015-12-21 LAB — CBC
HCT: 19 % — ABNORMAL LOW (ref 39.0–52.0)
HCT: 24.1 % — ABNORMAL LOW (ref 39.0–52.0)
HEMATOCRIT: 19.6 % — AB (ref 39.0–52.0)
HEMOGLOBIN: 8.4 g/dL — AB (ref 13.0–17.0)
Hemoglobin: 6.7 g/dL — CL (ref 13.0–17.0)
Hemoglobin: 6.4 g/dL — CL (ref 13.0–17.0)
MCH: 30.9 pg (ref 26.0–34.0)
MCH: 30.3 pg (ref 26.0–34.0)
MCH: 30.1 pg (ref 26.0–34.0)
MCHC: 34.2 g/dL (ref 30.0–36.0)
MCHC: 33.7 g/dL (ref 30.0–36.0)
MCHC: 34.9 g/dL (ref 30.0–36.0)
MCV: 90.3 fL (ref 78.0–100.0)
MCV: 90 fL (ref 78.0–100.0)
MCV: 86.4 fL (ref 78.0–100.0)
PLATELETS: 147 10*3/uL — AB (ref 150–400)
PLATELETS: 153 10*3/uL (ref 150–400)
Platelets: 147 10*3/uL — ABNORMAL LOW (ref 150–400)
RBC: 2.17 MIL/uL — AB (ref 4.22–5.81)
RBC: 2.11 MIL/uL — AB (ref 4.22–5.81)
RBC: 2.79 MIL/uL — ABNORMAL LOW (ref 4.22–5.81)
RDW: 13.2 % (ref 11.5–15.5)
RDW: 13.1 % (ref 11.5–15.5)
RDW: 13.8 % (ref 11.5–15.5)
WBC: 9.9 10*3/uL (ref 4.0–10.5)
WBC: 9 10*3/uL (ref 4.0–10.5)
WBC: 9.7 10*3/uL (ref 4.0–10.5)

## 2015-12-21 LAB — PREPARE RBC (CROSSMATCH)

## 2015-12-21 MED ORDER — SODIUM CHLORIDE 0.9 % IV SOLN: Freq: Once | INTRAVENOUS | Status: DC
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
## 2015-12-21 NOTE — Progress Notes (Signed)
Trauma Service Note  Subjective: Patient working with PT very well.  No acute distress.  Fatigued.  Objective: Vital signs in last 24 hours: Temp:  [98 F (36.7 C)-99.5 F (37.5 C)] 98.6 F (37 C) (09/20 0800) Pulse Rate:  [47-133] 127 (09/20 1021) Resp:  [13-96] 18 (09/20 1021) BP: (90-132)/(49-79) 113/70 (09/20 1021) SpO2:  [15 %-100 %] 100 % (09/20 1021) Last BM Date: 12/18/15  Intake/Output from previous day: 09/19 0701 - 09/20 0700 In: 3710 [P.O.:60; I.V.:3550; IV Piggyback:100] Out: 1275 [Urine:1175; Blood:100] Intake/Output this shift: Total I/O In: 75 [I.V.:75] Out: -   General: No distress.  Hard  Lungs: Clear with wheezing in the right base.  Oxygen saturations are good.  Abd: Benign  Extremities: No changes  Neuro: Intact  Lab Results: CBC   Recent Labs  12/21/15 0336 12/21/15 0648  WBC 9.9 9.0  HGB 6.7* 6.4*  HCT 19.6* 19.0*  PLT 147* 147*   BMET  Recent Labs  12/19/15 0250 12/21/15 0336  NA 141 132*  K 4.1 4.1  CL 112* 97*  CO2 24 28  GLUCOSE 149* 117*  BUN 13 8  CREATININE 1.06 0.69  CALCIUM 7.9* 7.7*   PT/INR  Recent Labs  12/18/15 1954  LABPROT 14.3  INR 1.10   ABG No results for input(s): PHART, HCO3 in the last 72 hours.  Invalid input(s): PCO2, PO2  Studies/Results: Dg Si Joints  Result Date: 12/20/2015 CLINICAL DATA:  ORIF of an open ankle fracture. Imaging for SI joint fusion. EXAM: DG C-ARM GT 120 MIN; BILATERAL SACROILIAC JOINTS - 3+ VIEW; RIGHT ANKLE - 2 VIEW a thickening and they are FLUOROSCOPY TIME:  Fluoroscopy Time:  1 minutes and 43 seconds Number of Acquired Spot Images: 8 COMPARISON:  12/18/2015 FINDINGS: Ankle images show placement of a lateral fixation plate across the distal fibula and 2 screws across the medial malleolus reducing the Fractures into anatomic alignment. The orthopedic hardware is well-seated. There is no evidence of an operative complication. Images of the SI joints show placement of 2  long screws from left to right across the SI joints. The orthopedic hardware appears well-seated. The SI joints are normally aligned. IMPRESSION: Operative imaging for SI joint fusion and left ankle fracture ORIF. Electronically Signed   By: Amie Portlandavid  Ormond M.D.   On: 12/20/2015 15:32   Dg Ankle 2 Views Right  Result Date: 12/20/2015 CLINICAL DATA:  ORIF of an open ankle fracture. Imaging for SI joint fusion. EXAM: DG C-ARM GT 120 MIN; BILATERAL SACROILIAC JOINTS - 3+ VIEW; RIGHT ANKLE - 2 VIEW a thickening and they are FLUOROSCOPY TIME:  Fluoroscopy Time:  1 minutes and 43 seconds Number of Acquired Spot Images: 8 COMPARISON:  12/18/2015 FINDINGS: Ankle images show placement of a lateral fixation plate across the distal fibula and 2 screws across the medial malleolus reducing the Fractures into anatomic alignment. The orthopedic hardware is well-seated. There is no evidence of an operative complication. Images of the SI joints show placement of 2 long screws from left to right across the SI joints. The orthopedic hardware appears well-seated. The SI joints are normally aligned. IMPRESSION: Operative imaging for SI joint fusion and left ankle fracture ORIF. Electronically Signed   By: Amie Portlandavid  Ormond M.D.   On: 12/20/2015 15:32   Dg Pelvis Comp Min 3v  Result Date: 12/21/2015 CLINICAL DATA:  Status post SI joint fusion EXAM: JUDET PELVIS - 3+ VIEW COMPARISON:  12/20/2015 FINDINGS: Two fixation screws are noted traversing the sacroiliac  joints bilaterally. Multiple pubic rami fractures are seen bilaterally. The sacral fracture fragments appear well aligned. IMPRESSION: Status post SI joint fusion. Multiple bilateral pubic rami fractures are seen. Electronically Signed   By: Alcide Clever M.D.   On: 12/21/2015 09:00   Dg C-arm Gt 120 Min  Result Date: 12/20/2015 CLINICAL DATA:  ORIF of an open ankle fracture. Imaging for SI joint fusion. EXAM: DG C-ARM GT 120 MIN; BILATERAL SACROILIAC JOINTS - 3+ VIEW; RIGHT  ANKLE - 2 VIEW a thickening and they are FLUOROSCOPY TIME:  Fluoroscopy Time:  1 minutes and 43 seconds Number of Acquired Spot Images: 8 COMPARISON:  12/18/2015 FINDINGS: Ankle images show placement of a lateral fixation plate across the distal fibula and 2 screws across the medial malleolus reducing the Fractures into anatomic alignment. The orthopedic hardware is well-seated. There is no evidence of an operative complication. Images of the SI joints show placement of 2 long screws from left to right across the SI joints. The orthopedic hardware appears well-seated. The SI joints are normally aligned. IMPRESSION: Operative imaging for SI joint fusion and left ankle fracture ORIF. Electronically Signed   By: Amie Portland M.D.   On: 12/20/2015 15:32    Anti-infectives: Anti-infectives    Start     Dose/Rate Route Frequency Ordered Stop   12/20/15 1110  ceFAZolin (ANCEF) IVPB 2g/100 mL premix  Status:  Discontinued     2 g 200 mL/hr over 30 Minutes Intravenous To ShortStay Surgical 12/20/15 0137 12/20/15 1622   12/19/15 0700  ceFAZolin (ANCEF) IVPB 1 g/50 mL premix     1 g 100 mL/hr over 30 Minutes Intravenous Every 8 hours 12/19/15 0631 12/24/15 0559   12/18/15 2321  ceFAZolin (ANCEF) 2,000 mg in dextrose 5 % 50 mL IVPB     over 30 Minutes Intravenous Continuous PRN 12/18/15 2321 12/18/15 2100   12/18/15 2000  ceFAZolin (ANCEF) IVPB 2g/100 mL premix     2 g 200 mL/hr over 30 Minutes Intravenous  Once 12/18/15 1956 12/20/15 1309   12/18/15 1954  ceFAZolin (ANCEF) 2-4 GM/100ML-% IVPB    Comments:  Christella Scheuermann : cabinet override      12/18/15 1954 12/19/15 0759      Assessment/Plan: s/p Procedure(s): OPEN REDUCTION INTERNAL FIXATION (ORIF) OPEN ANKLE FRACTURE SACRO-ILIAC PINNING Transfuse two units of PRBCs.  Check labs tomorrow. Transfer to Ortho unit after blood given  LOS: 2 days   Marta Lamas. Gae Bon, MD, FACS [phone] Trauma Surgeon 12/21/2015

## 2015-12-21 NOTE — Progress Notes (Signed)
Patient ID: Lance SabalDavid Medinger, male   DOB: [birth year], 40 y.o.   MRN: [id number] Postoperative day 1 open reduction internal fixation bimalleolar right ankle fracture. Patient denies any pain in the right ankle. The fracture boot is in place. Patient may be touchdown weightbearing on the right for transfers. Anticipate patient will need discharge to skilled nursing with his pelvic fracture and ankle fracture.
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
## 2015-12-21 NOTE — Progress Notes (Signed)
CRITICAL VALUE ALERT  Critical value received:  Hemglobin: 6.7   Date of notification:  12/21/2015  Time of notification:  0540  Critical value read back:Yes.    Nurse who received alert:  Fransisco HertzJones, Ramiel Forti D, RN   MD notified (1st page):    Time of first page:    MD notified (2nd page):  Time of second page:  Responding MD:    Time MD responded:   Ask Lab to redraw. Order placed for redraw
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
## 2015-12-21 NOTE — Progress Notes (Signed)
Rehab Admissions Coordinator Note:  Patient was screened by Trish MageLogue, Kazuma Elena M for appropriateness for an Inpatient Acute Rehab Consult.  At this time, we are recommending Inpatient Rehab consult.  Lelon FrohlichLogue, Archie Shea M 12/21/2015, 11:50 AM  I can be reached at [phone].

## 2015-12-21 NOTE — Progress Notes (Signed)
On arrival to 5N pt's HR in 120's, pt on full dose dilaudid PCA-drowsy but arousable. He stated around 7:30 he felt chest pressure but could not say when it started, only "a while ago." Other VSS. Dr. Corliss Skainssuei notified and received orders for CBC and BMET. Will pass onto oncoming RN.  OrganHudson, Latricia HeftKorie G

## 2015-12-21 NOTE — Progress Notes (Signed)
Call received per floor RN regarding Pt with chest pain, swelling in scrotum and increased HR . EKG completed at 2114 showing sinus tach.  Pt on PCA dilaudid and encouraged per floor RN to use pain button. RN advised to update trauma MD regarding PT chest pain and EKG results. Dr. Corliss Skainssuei updated, no new orders at that time. Call back received from RN with update from MD communication, Pt resting.  Agreed to see Pt during rounds later this evening. Pt seen at 2345 this evening asleep in bed, on RN last assessment Pt denies chest pain and scrotum swelling unchanged from earlier in shift. RN advised to monitor Pt closely and update Provider and myself for worsening changes.
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
## 2015-12-22 DIAGNOSIS — S82891B Other fracture of right lower leg, initial encounter for open fracture type I or II: Secondary | ICD-10-CM | POA: Diagnosis present

## 2015-12-22 DIAGNOSIS — S0101XA Laceration without foreign body of scalp, initial encounter: Secondary | ICD-10-CM | POA: Diagnosis present

## 2015-12-22 LAB — TYPE AND SCREEN
ABO/RH(D): A POS
Antibody Screen: NEGATIVE
UNIT DIVISION: 0
UNIT DIVISION: 0
UNIT DIVISION: 0
Unit division: 0

## 2015-12-22 LAB — CBC WITH DIFFERENTIAL/PLATELET
Basophils Absolute: 0 10*3/uL (ref 0.0–0.1)
Basophils Relative: 0 %
Eosinophils Absolute: 0.3 10*3/uL (ref 0.0–0.7)
Eosinophils Relative: 3 %
HCT: 25.8 % — ABNORMAL LOW (ref 39.0–52.0)
HEMOGLOBIN: 8.8 g/dL — AB (ref 13.0–17.0)
LYMPHS ABS: 1.1 10*3/uL (ref 0.7–4.0)
LYMPHS PCT: 10 %
MCH: 29.9 pg (ref 26.0–34.0)
MCHC: 34.1 g/dL (ref 30.0–36.0)
MCV: 87.8 fL (ref 78.0–100.0)
MONOS PCT: 8 %
Monocytes Absolute: 0.9 10*3/uL (ref 0.1–1.0)
NEUTROS PCT: 79 %
Neutro Abs: 8.2 10*3/uL — ABNORMAL HIGH (ref 1.7–7.7)
Platelets: 190 10*3/uL (ref 150–400)
RBC: 2.94 MIL/uL — AB (ref 4.22–5.81)
RDW: 14.1 % (ref 11.5–15.5)
WBC: 10.4 10*3/uL (ref 4.0–10.5)

## 2015-12-22 MED ORDER — BACITRACIN ZINC 500 UNIT/GM EX OINT
TOPICAL_OINTMENT | Freq: Two times a day (BID) | CUTANEOUS | Status: DC
  Administered 2015-12-22 – 2015-12-23 (×3): via TOPICAL
  Filled 2015-12-22: qty 28.35

## 2015-12-22 MED ORDER — HYDROMORPHONE HCL 1 MG/ML IJ SOLN: 0.5000 mg | INTRAMUSCULAR | Status: DC | PRN

## 2015-12-22 MED ORDER — POLYETHYLENE GLYCOL 3350 17 G PO PACK
17.0000 g | PACK | Freq: Every day | ORAL | Status: DC
  Administered 2015-12-22 – 2015-12-23 (×2): 17 g via ORAL
  Filled 2015-12-22 (×2): qty 1

## 2015-12-22 MED ORDER — ENOXAPARIN SODIUM 30 MG/0.3ML ~~LOC~~ SOLN
30.0000 mg | Freq: Two times a day (BID) | SUBCUTANEOUS | Status: DC
  Administered 2015-12-22 – 2015-12-23 (×3): 30 mg via SUBCUTANEOUS
  Filled 2015-12-22 (×3): qty 0.3

## 2015-12-22 MED ORDER — OXYCODONE HCL 5 MG PO TABS
5.0000 mg | ORAL_TABLET | ORAL | Status: DC | PRN
  Administered 2015-12-22 – 2015-12-23 (×7): 15 mg via ORAL
  Filled 2015-12-22 (×7): qty 3
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
## 2015-12-22 NOTE — Progress Notes (Signed)
Inpatient Rehabilitation  Met with patient to discuss team's recommendation for IP Rehab.  Shared booklets and answered questions.  Patient requested that I reach out to his significant other, Zenda Alpers as she will be caregiver post IP Rehab stay.  Will follow for IP Rehab admission.  Have a bed to offer tomorrow pending continued medical stability.  Please call with questions.   Carmelia Roller., CCC/SLP Admission Coordinator  [place]  Cell [phone]
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
## 2015-12-22 NOTE — Progress Notes (Signed)
On assessment pt denies chest pain. RN will continue to monitor

## 2015-12-22 NOTE — Progress Notes (Signed)
Patient ID: Lance SabalDavid Spadoni, male   DOB: [birth year], 40 y.o.   MRN: [id number]   LOS: 3 days   Subjective: No unexpected c/o.   Objective: Vital signs in last 24 hours: Temp:  [98.3 F (36.8 C)-101.5 F (38.6 C)] 98.9 F (37.2 C) (09/21 0500) Pulse Rate:  [113-131] 114 (09/21 0500) Resp:  [11-29] 14 (09/21 0800) BP: (100-137)/(52-87) 118/74 (09/21 0500) SpO2:  [91 %-100 %] 95 % (09/21 0800) Last BM Date: 12/18/15   Laboratory  CBC  Recent Labs  12/21/15 1958 12/22/15 0546  WBC 9.7 10.4  HGB 8.4* 8.8*  HCT 24.1* 25.8*  PLT 153 190   BMET  Recent Labs  12/21/15 0336 12/21/15 1958  NA 132* 131*  K 4.1 3.5  CL 97* 97*  CO2 28 26  GLUCOSE 117* 125*  BUN 8 8  CREATININE 0.69 0.63  CALCIUM 7.7* 7.5*    Physical Exam General appearance: alert and no distress Resp: wheezes bilaterally and mild Cardio: Mild tachycardida GI: normal findings: bowel sounds normal and soft, non-tender Extremities: NVI   Assessment/Plan: PHBC Scalp lac -- Local care Pelvic fxs s/p bilateral SI screw fixation -- per Dr. Carola FrostHandy, bed to chair transfers only on LLE Open right ankle fx s/p I&D, ORIF -- NWB per Dr. Lajoyce Cornersuda, d/c prophylactic abx ABL anemia -- Stable FEN -- D/C PCA, orals for pain, SL IV VTE -- SCD's, start Lovenox Dispo -- CIR when bed available    Freeman CaldronMichael J. Saleen Peden, PA-C Pager: [phone] General Trauma PA Pager: [phone]  12/22/2015
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
## 2015-12-23 ENCOUNTER — Encounter (HOSPITAL_COMMUNITY): Payer: Self-pay | Admitting: Orthopedic Surgery

## 2015-12-23 ENCOUNTER — Inpatient Hospital Stay (HOSPITAL_COMMUNITY)
Admission: RE | Admit: 2015-12-23 | Discharge: 2015-12-29 | DRG: 560 | Disposition: A | Discharge status: Home Health | Payer: Medicaid Other | Source: Intra-hospital | Attending: Physical Medicine & Rehabilitation | Admitting: Physical Medicine & Rehabilitation

## 2015-12-23 DIAGNOSIS — R339 Retention of urine, unspecified: Secondary | ICD-10-CM

## 2015-12-23 DIAGNOSIS — S32810D Multiple fractures of pelvis with stable disruption of pelvic ring, subsequent encounter for fracture with routine healing: Secondary | ICD-10-CM

## 2015-12-23 DIAGNOSIS — F17200 Nicotine dependence, unspecified, uncomplicated: Secondary | ICD-10-CM

## 2015-12-23 DIAGNOSIS — S82892A Other fracture of left lower leg, initial encounter for closed fracture: Secondary | ICD-10-CM

## 2015-12-23 DIAGNOSIS — K5900 Constipation, unspecified: Secondary | ICD-10-CM | POA: Diagnosis not present

## 2015-12-23 DIAGNOSIS — S32810A Multiple fractures of pelvis with stable disruption of pelvic ring, initial encounter for closed fracture: Secondary | ICD-10-CM | POA: Diagnosis present

## 2015-12-23 DIAGNOSIS — S82841E Displaced bimalleolar fracture of right lower leg, subsequent encounter for open fracture type I or II with routine healing: Secondary | ICD-10-CM | POA: Diagnosis present

## 2015-12-23 DIAGNOSIS — D72829 Elevated white blood cell count, unspecified: Secondary | ICD-10-CM | POA: Diagnosis not present

## 2015-12-23 DIAGNOSIS — E876 Hypokalemia: Secondary | ICD-10-CM | POA: Diagnosis not present

## 2015-12-23 DIAGNOSIS — D62 Acute posthemorrhagic anemia: Secondary | ICD-10-CM | POA: Diagnosis not present

## 2015-12-23 DIAGNOSIS — Z22322 Carrier or suspected carrier of Methicillin resistant Staphylococcus aureus: Secondary | ICD-10-CM

## 2015-12-23 DIAGNOSIS — S82892D Other fracture of left lower leg, subsequent encounter for closed fracture with routine healing: Secondary | ICD-10-CM | POA: Diagnosis not present

## 2015-12-23 DIAGNOSIS — S32811D Multiple fractures of pelvis with unstable disruption of pelvic ring, subsequent encounter for fracture with routine healing: Secondary | ICD-10-CM

## 2015-12-23 DIAGNOSIS — S32810S Multiple fractures of pelvis with stable disruption of pelvic ring, sequela: Secondary | ICD-10-CM

## 2015-12-23 DIAGNOSIS — S82891D Other fracture of right lower leg, subsequent encounter for closed fracture with routine healing: Secondary | ICD-10-CM

## 2015-12-23 DIAGNOSIS — E871 Hypo-osmolality and hyponatremia: Secondary | ICD-10-CM | POA: Diagnosis present

## 2015-12-23 DIAGNOSIS — S82891A Other fracture of right lower leg, initial encounter for closed fracture: Secondary | ICD-10-CM | POA: Diagnosis present

## 2015-12-23 DIAGNOSIS — S82892S Other fracture of left lower leg, sequela: Secondary | ICD-10-CM

## 2015-12-23 DIAGNOSIS — S82891S Other fracture of right lower leg, sequela: Secondary | ICD-10-CM

## 2015-12-23 LAB — BASIC METABOLIC PANEL
Anion gap: 8 (ref 5–15)
BUN: 6 mg/dL (ref 6–20)
CHLORIDE: 95 mmol/L — AB (ref 101–111)
CO2: 28 mmol/L (ref 22–32)
CREATININE: 0.6 mg/dL — AB (ref 0.61–1.24)
Calcium: 7.8 mg/dL — ABNORMAL LOW (ref 8.9–10.3)
GFR calc Af Amer: >60 mL/min (ref >60)
GFR calc non Af Amer: >60 mL/min (ref >60)
Glucose, Bld: 113 mg/dL — ABNORMAL HIGH (ref 65–99)
Potassium: 3.1 mmol/L — ABNORMAL LOW (ref 3.5–5.1)
Sodium: 131 mmol/L — ABNORMAL LOW (ref 135–145)

## 2015-12-23 MED ORDER — ACETAMINOPHEN 325 MG PO TABS
650.0000 mg | ORAL_TABLET | ORAL | Status: DC | PRN
  Administered 2015-12-23 – 2015-12-24 (×2): 650 mg via ORAL

## 2015-12-23 MED ORDER — TRAZODONE HCL 50 MG PO TABS: 25.0000 mg | ORAL_TABLET | Freq: Every evening | ORAL | Status: DC | PRN

## 2015-12-23 MED ORDER — PROCHLORPERAZINE MALEATE 5 MG PO TABS: 5.0000 mg | ORAL_TABLET | Freq: Four times a day (QID) | ORAL | Status: DC | PRN

## 2015-12-23 MED ORDER — ONDANSETRON HCL 4 MG/2ML IJ SOLN: 4.0000 mg | Freq: Four times a day (QID) | INTRAMUSCULAR | Status: DC | PRN

## 2015-12-23 MED ORDER — ONDANSETRON HCL 4 MG PO TABS: 4.0000 mg | ORAL_TABLET | Freq: Four times a day (QID) | ORAL | Status: DC | PRN

## 2015-12-23 MED ORDER — POLYETHYLENE GLYCOL 3350 17 G PO PACK
17.0000 g | PACK | Freq: Two times a day (BID) | ORAL | Status: DC
  Administered 2015-12-23 – 2015-12-29 (×10): 17 g via ORAL
  Filled 2015-12-23 (×12): qty 1

## 2015-12-23 MED ORDER — ACETAMINOPHEN 325 MG PO TABS
325.0000 mg | ORAL_TABLET | ORAL | Status: DC | PRN
  Filled 2015-12-23 (×2): qty 2

## 2015-12-23 MED ORDER — DIPHENHYDRAMINE HCL 12.5 MG/5ML PO ELIX: 12.5000 mg | ORAL_SOLUTION | Freq: Four times a day (QID) | ORAL | Status: DC | PRN

## 2015-12-23 MED ORDER — METHOCARBAMOL 500 MG PO TABS
500.0000 mg | ORAL_TABLET | Freq: Four times a day (QID) | ORAL | Status: DC | PRN
  Administered 2015-12-23 – 2015-12-28 (×3): 500 mg via ORAL
  Filled 2015-12-23 (×4): qty 1

## 2015-12-23 MED ORDER — GUAIFENESIN-DM 100-10 MG/5ML PO SYRP: 5.0000 mL | ORAL_SOLUTION | Freq: Four times a day (QID) | ORAL | Status: DC | PRN

## 2015-12-23 MED ORDER — MUPIROCIN 2 % EX OINT
1.0000 "application " | TOPICAL_OINTMENT | Freq: Two times a day (BID) | CUTANEOUS | Status: AC
  Administered 2015-12-23: 1 via NASAL
  Filled 2015-12-23: qty 22

## 2015-12-23 MED ORDER — DOCUSATE SODIUM 100 MG PO CAPS
100.0000 mg | ORAL_CAPSULE | Freq: Two times a day (BID) | ORAL | Status: DC
  Administered 2015-12-23 – 2015-12-29 (×12): 100 mg via ORAL
  Filled 2015-12-23 (×12): qty 1

## 2015-12-23 MED ORDER — ENOXAPARIN SODIUM 30 MG/0.3ML ~~LOC~~ SOLN
30.0000 mg | Freq: Two times a day (BID) | SUBCUTANEOUS | Status: DC
  Administered 2015-12-23 – 2015-12-29 (×12): 30 mg via SUBCUTANEOUS
  Filled 2015-12-23 (×12): qty 0.3

## 2015-12-23 MED ORDER — FLEET ENEMA 7-19 GM/118ML RE ENEM: 1.0000 | ENEMA | Freq: Once | RECTAL | Status: DC | PRN

## 2015-12-23 MED ORDER — BISACODYL 10 MG RE SUPP
10.0000 mg | Freq: Every day | RECTAL | Status: DC | PRN
  Administered 2015-12-25: 10 mg via RECTAL
  Filled 2015-12-23: qty 1

## 2015-12-23 MED ORDER — OXYCODONE HCL 5 MG PO TABS
5.0000 mg | ORAL_TABLET | ORAL | Status: DC | PRN
  Administered 2015-12-23 – 2015-12-29 (×23): 10 mg via ORAL
  Filled 2015-12-23 (×24): qty 2

## 2015-12-23 MED ORDER — TRAMADOL HCL 50 MG PO TABS
50.0000 mg | ORAL_TABLET | Freq: Four times a day (QID) | ORAL | Status: DC
  Administered 2015-12-23 – 2015-12-29 (×22): 50 mg via ORAL
  Filled 2015-12-23 (×23): qty 1

## 2015-12-23 MED ORDER — PROCHLORPERAZINE 25 MG RE SUPP: 12.5000 mg | Freq: Four times a day (QID) | RECTAL | Status: DC | PRN

## 2015-12-23 MED ORDER — ALUM & MAG HYDROXIDE-SIMETH 200-200-20 MG/5ML PO SUSP: 30.0000 mL | ORAL | Status: DC | PRN

## 2015-12-23 MED ORDER — PROCHLORPERAZINE EDISYLATE 5 MG/ML IJ SOLN: 5.0000 mg | Freq: Four times a day (QID) | INTRAMUSCULAR | Status: DC | PRN

## 2015-12-23 MED ORDER — BACITRACIN ZINC 500 UNIT/GM EX OINT
TOPICAL_OINTMENT | Freq: Two times a day (BID) | CUTANEOUS | Status: DC
  Administered 2015-12-23: 1 via TOPICAL
  Administered 2015-12-24 (×2): via TOPICAL
  Administered 2015-12-25: 1 via TOPICAL
  Administered 2015-12-25 – 2015-12-29 (×8): via TOPICAL
  Filled 2015-12-23 (×3): qty 28.35

## 2015-12-23 MED ORDER — IPRATROPIUM-ALBUTEROL 0.5-2.5 (3) MG/3ML IN SOLN: 3.0000 mL | Freq: Four times a day (QID) | RESPIRATORY_TRACT | Status: DC | PRN

## 2015-12-23 NOTE — Progress Notes (Signed)
Inpatient Rehabilitation  I have received medical clearance and have a bed available to offer today. I have spoken with patient and significant other who are in agreement with plan for IP Rehab and will proceed with admission today.  Please call with questions.    Charlane FerrettiMelissa Samadhi Mahurin, M.A., CCC/SLP Admission Coordinator  [facility]  Cell [phone]

## 2015-12-23 NOTE — Progress Notes (Signed)
PT Cancellation Note  Patient Details Name: Lance Rivera MRN: [id number] DOB: 1975-11-03   Cancelled Treatment:    Reason Eval/Treat Not Completed: Other (comment) (Pt to transfer to CIR.  will defer tx.  )   [name] 12/23/2015, 4:47 PM Joycelyn RuaAimee Reyden Smith, PTA pager [phone]
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
## 2015-12-23 NOTE — Progress Notes (Signed)
Patient ID: Lance Rivera, male   DOB: 1975/06/20, 40 y.o.   MRN: [id number]   LOS: 4 days   Subjective: No new c/o.   Objective: Vital signs in last 24 hours: Temp:  [98.2 F (36.8 C)-99.3 F (37.4 C)] 98.9 F (37.2 C) (09/22 0416) Pulse Rate:  [107-112] 107 (09/22 0416) Resp:  [14-18] 18 (09/22 0416) BP: (109-120)/(71-84) 109/71 (09/22 0416) SpO2:  [95 %-98 %] 97 % (09/22 0416) Last BM Date: 12/18/15   Laboratory  BMET  Recent Labs  12/21/15 1958 12/23/15 0609  NA 131* 131*  K 3.5 3.1*  CL 97* 95*  CO2 26 28  GLUCOSE 125* 113*  BUN 8 6  CREATININE 0.63 0.60*  CALCIUM 7.5* 7.8*    Physical Exam General appearance: alert and no distress Resp: clear to auscultation bilaterally Cardio: regular rate and rhythm GI: normal findings: bowel sounds normal and soft, non-tender Extremities: NVI   Assessment/Plan: PHBC Scalp lac -- Local care Pelvic fxs s/p bilateral SI screw fixation -- per Dr. Carola FrostHandy, bed to chair transfers only on LLE Open right ankle fx s/p I&D, ORIF -- NWB per Dr. Lajoyce Cornersuda, d/c prophylactic abx ABL anemia -- Stable FEN -- No issues VTE -- SCD's, Lovenox Dispo -- CIR when bed available    Freeman CaldronMichael J. Threasa Kinch, PA-C Pager: [phone] General Trauma PA Pager: [phone]  12/23/2015
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
## 2015-12-24 ENCOUNTER — Encounter (HOSPITAL_COMMUNITY): Payer: Self-pay

## 2015-12-24 ENCOUNTER — Inpatient Hospital Stay (HOSPITAL_COMMUNITY): Payer: Self-pay | Admitting: Occupational Therapy

## 2015-12-24 ENCOUNTER — Inpatient Hospital Stay (HOSPITAL_COMMUNITY): Payer: Self-pay | Admitting: Physical Therapy

## 2015-12-24 DIAGNOSIS — E876 Hypokalemia: Secondary | ICD-10-CM

## 2015-12-24 LAB — COMPREHENSIVE METABOLIC PANEL
ALT: 61 U/L (ref 17–63)
ANION GAP: 8 (ref 5–15)
AST: 75 U/L — ABNORMAL HIGH (ref 15–41)
Albumin: 2.2 g/dL — ABNORMAL LOW (ref 3.5–5.0)
Alkaline Phosphatase: 79 U/L (ref 38–126)
BUN: 5 mg/dL — ABNORMAL LOW (ref 6–20)
CALCIUM: 8.1 mg/dL — AB (ref 8.9–10.3)
CHLORIDE: 96 mmol/L — AB (ref 101–111)
CO2: 31 mmol/L (ref 22–32)
CREATININE: 0.59 mg/dL — AB (ref 0.61–1.24)
Glucose, Bld: 110 mg/dL — ABNORMAL HIGH (ref 65–99)
Potassium: 3 mmol/L — ABNORMAL LOW (ref 3.5–5.1)
SODIUM: 135 mmol/L (ref 135–145)
Total Bilirubin: 2.5 mg/dL — ABNORMAL HIGH (ref 0.3–1.2)
Total Protein: 4.7 g/dL — ABNORMAL LOW (ref 6.5–8.1)

## 2015-12-24 LAB — CBC WITH DIFFERENTIAL/PLATELET
Basophils Absolute: 0.1 10*3/uL (ref 0.0–0.1)
Basophils Relative: 1 %
EOS ABS: 0.3 10*3/uL (ref 0.0–0.7)
EOS PCT: 3 %
HCT: 24.6 % — ABNORMAL LOW (ref 39.0–52.0)
Hemoglobin: 8.5 g/dL — ABNORMAL LOW (ref 13.0–17.0)
LYMPHS ABS: 1.2 10*3/uL (ref 0.7–4.0)
LYMPHS PCT: 14 %
MCH: 30.5 pg (ref 26.0–34.0)
MCHC: 34.6 g/dL (ref 30.0–36.0)
MCV: 88.2 fL (ref 78.0–100.0)
MONO ABS: 0.9 10*3/uL (ref 0.1–1.0)
MONOS PCT: 10 %
Neutro Abs: 6.3 10*3/uL (ref 1.7–7.7)
Neutrophils Relative %: 72 %
PLATELETS: 308 10*3/uL (ref 150–400)
RBC: 2.79 MIL/uL — ABNORMAL LOW (ref 4.22–5.81)
RDW: 13.5 % (ref 11.5–15.5)
WBC: 8.8 10*3/uL (ref 4.0–10.5)

## 2015-12-24 LAB — URINALYSIS, ROUTINE W REFLEX MICROSCOPIC
BILIRUBIN URINE: NEGATIVE
Glucose, UA: NEGATIVE mg/dL
Hgb urine dipstick: NEGATIVE
KETONES UR: NEGATIVE mg/dL
LEUKOCYTES UA: NEGATIVE
NITRITE: NEGATIVE
PH: 8 (ref 5.0–8.0)
Protein, ur: NEGATIVE mg/dL
SPECIFIC GRAVITY, URINE: 1.015 (ref 1.005–1.030)

## 2015-12-24 MED ORDER — POTASSIUM CHLORIDE CRYS ER 20 MEQ PO TBCR
20.0000 meq | EXTENDED_RELEASE_TABLET | Freq: Two times a day (BID) | ORAL | Status: AC
  Administered 2015-12-24 – 2015-12-25 (×3): 20 meq via ORAL
  Filled 2015-12-24 (×3): qty 1

## 2015-12-24 NOTE — Progress Notes (Signed)
Physical Therapy Session Note  Patient Details  Name: Lance Rivera MRN: [id number] Date of Birth: March 29, 1976  Today's Date: 12/24/2015 PT Individual Time: 1530-1600 PT Individual Time Calculation (min): 30 min    Short Term Goals:Week 1:  PT Short Term Goal 1 (Week 1): =LTGs due to ELOS  Skilled Therapeutic Interventions/Progress Updates: Pt resting in bed upon arrival, very lethargic, denies pain, and agreeable to therapy but declining out of bed.  PT provided pt with hand outs for home measurements, how to build a ramp, bumping w/c up steps, and supine HEP for LEs.  Reviewed HEP verbally and instructed pt in LLE ankle circles x10 reps, bilateral heel slides/glute sets/quad sets x10 reps.  Pt falling asleep during therex and requiring cuing to remain awake.  Left supine in bed at end of session, call bell in reach and needs met.    Therapy Documentation Precautions:  Precautions Precautions: Fall Required Braces or Orthoses: Other Brace/Splint Other Brace/Splint: CAM boot RLE Restrictions Weight Bearing Restrictions: Yes RLE Weight Bearing: Non weight bearing LLE Weight Bearing: Non weight bearing   See Function Navigator for Current Functional Status.   Therapy/Group: Individual Therapy  Earnest Conroy Penven-Crew 12/24/2015, 4:23 PM

## 2015-12-24 NOTE — Progress Notes (Signed)
Lance Rivera is a 40 y.o. male [birth year] [id number]  Subjective: No new complaints beyond continued pain. No new problems. Slept poor due to pain early in evening -Feeling OK now.  Objective: Vital signs in last 24 hours: Temp:  [98.1 F (36.7 C)-98.4 F (36.9 C)] 98.1 F (36.7 C) (09/23 0556) Pulse Rate:  [105-110] 105 (09/23 0556) Resp:  [18] 18 (09/23 0556) BP: (121-123)/(74-79) 121/79 (09/23 0556) SpO2:  [97 %-100 %] 97 % (09/23 0556) Weight:  [107.3 kg (236 lb 8.9 oz)] 107.3 kg (236 lb 8.9 oz) (09/22 1734) Weight change:  Last BM Date: 12/23/15  Intake/Output from previous day: 09/22 0701 - 09/23 0700 In: 120 [P.O.:120] Out: 1300 [Urine:1300]  Physical Exam General: No apparent distress   Supine in bed Lungs: Normal effort. Lungs clear to auscultation, no crackles or wheezes. Cardiovascular: Regular rate and rhythm, no edema Neurological: No new neurological deficits Wounds:  Clean, dry, intact. No signs of infection.  Lab Results: BMET    Component Value Date/Time   NA 135 12/24/2015 0440   K 3.0 (L) 12/24/2015 0440   CL 96 (L) 12/24/2015 0440   CO2 31 12/24/2015 0440   GLUCOSE 110 (H) 12/24/2015 0440   BUN 5 (L) 12/24/2015 0440   CREATININE 0.59 (L) 12/24/2015 0440   CALCIUM 8.1 (L) 12/24/2015 0440   GFRNONAA >60 12/24/2015 0440   GFRAA >60 12/24/2015 0440   CBC    Component Value Date/Time   WBC 8.8 12/24/2015 0440   RBC 2.79 (L) 12/24/2015 0440   HGB 8.5 (L) 12/24/2015 0440   HCT 24.6 (L) 12/24/2015 0440   PLT 308 12/24/2015 0440   MCV 88.2 12/24/2015 0440   MCH 30.5 12/24/2015 0440   MCHC 34.6 12/24/2015 0440   RDW 13.5 12/24/2015 0440   LYMPHSABS 1.2 12/24/2015 0440   MONOABS 0.9 12/24/2015 0440   EOSABS 0.3 12/24/2015 0440   BASOSABS 0.1 12/24/2015 0440   CBG's (last 3):  No results for input(s): GLUCAP in the last 72 hours. LFT's Lab Results  Component Value Date   ALT 61 12/24/2015   AST 75 (H) 12/24/2015   ALKPHOS 79 12/24/2015    BILITOT 2.5 (H) 12/24/2015    Studies/Results: No results found.  Medications:  I have reviewed the patient's current medications. Scheduled Medications: . bacitracin   Topical BID  . docusate sodium  100 mg Oral BID  . enoxaparin (LOVENOX) injection  30 mg Subcutaneous Q12H  . polyethylene glycol  17 g Oral BID  . traMADol  50 mg Oral QID   PRN Medications: acetaminophen, acetaminophen, alum & mag hydroxide-simeth, bisacodyl, diphenhydrAMINE, guaiFENesin-dextromethorphan, ipratropium-albuterol, methocarbamol, ondansetron **OR** ondansetron (ZOFRAN) IV, oxyCODONE, prochlorperazine **OR** prochlorperazine **OR** prochlorperazine, sodium phosphate, traZODone  Assessment/Plan: Principal Problem:   Closed bilateral ankle fractures Active Problems:   Multiple closed pelvic fractures with disruption of pelvic circle (HCC)   Hyponatremia Medical Problem List and Plan: 1.  Functional and mobility deficits secondary to right bimalleolar ankle fracture, multiple pelvic fractures s/p surgical fixation. Continue CIR as ongoing 2.  DVT Prophylaxis/Anticoagulation: Pharmaceutical: Lovenox 3. Pain Management: Will continue oxycodone prn. Schedule tramadol for more consist pain control . Improved last PM with addition of Tylenol! 4. Mood: team to provide ego support. LCSW to follow for evaluation and support.  5. Neuropsych: This patient is capable of making decisions on his own behalf. 6. Skin/Wound Care: Routine pressure relief measures. Monitor incisions for healing.  7. Fluids/Electrolytes/Nutrition: Monitor I/O. Offer supplements between meals. 8. ABLA:  Will  add iron supplement.  9. Open right ankle fracture s/p I &D with ORIF : Complete prophylactic antibiotic 9/17-9/21. TTWB for transfers only.  10. Bilateral unstable pelvic fractures s/p Bilateral SI screw fixation: NWB for 3 months? --will need prolonged anticoagulation per ortho. 11.Hypokalemia: Will continue supplement and recheck    12. Hyponatremia: Likely dilutional --encourage intake.  13. Mild leucocytosis: Encourage IS. Monitor for signs of infection.  14. Constipation:miralax bid.   supp PR prn.  15. Urinary retention: Has required I/O caths. Will check UA/UCS.    Length of stay, days: 1   Alastor Kneale A. Felicity CoyerLeschber, MD 12/24/2015, 9:51 AM
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
## 2015-12-25 ENCOUNTER — Inpatient Hospital Stay (HOSPITAL_COMMUNITY): Payer: Self-pay | Admitting: Occupational Therapy

## 2015-12-25 ENCOUNTER — Inpatient Hospital Stay (HOSPITAL_COMMUNITY): Payer: Self-pay

## 2015-12-25 DIAGNOSIS — E871 Hypo-osmolality and hyponatremia: Secondary | ICD-10-CM

## 2015-12-25 NOTE — Progress Notes (Signed)
Physical Therapy Session Note  Patient Details  Name: Allyson SabalDavid Rickerson MRN: [id number] Date of Birth: 06/05/1975  Today's Date: 12/25/2015 PT Individual Time: 1300-1340 PT Individual Time Calculation (min): 40 min    Short Term Goals: Week 1:  PT Short Term Goal 1 (Week 1): =LTGs due to ELOS  Skilled Therapeutic Interventions/Progress Updates:    Session focused on family education with pt's girlfriend mainly with other family present including his aunt. Hands on training with girlfriend for transfers in ADL apartment for bed mobility, A/P transfers, and w/c parts management. Return demonstration successful with pt directing care. Discussed importance of getting home measurements to determine w/c accessibility in the home and to assist with recommendations for home set-up. Education on w/c bumping vs ramp and available assist needed since family members have health limitations.    Therapy Documentation Precautions:  Precautions Precautions: Fall Required Braces or Orthoses: Other Brace/Splint Other Brace/Splint: CAM boot RLE Restrictions Weight Bearing Restrictions: Yes RLE Weight Bearing: Non weight bearing LLE Weight Bearing: Non weight bearing  Pain: Premedicated for BLE pain. Unrated.    See Function Navigator for Current Functional Status.   Therapy/Group: Individual Therapy  Karolee StampsGray, Shatera Rennert Darrol PokeBrescia  Alveta Quintela B. Ruston Fedora, PT, DPT  12/25/2015, 2:39 PM

## 2015-12-25 NOTE — Progress Notes (Signed)
Remon Quinto is a 40 y.o. male 03-10-1976 [id number]  Subjective: C/o pain in legs. No new problems. Slept poor due to pain early in evening   Objective: Vital signs in last 24 hours: Temp:  [98.1 F (36.7 C)-98.2 F (36.8 C)] 98.1 F (36.7 C) (09/24 1402) Pulse Rate:  [103-105] 105 (09/24 1402) Resp:  [18] 18 (09/24 1402) BP: (122-123)/(64-67) 122/64 (09/24 1402) SpO2:  [97 %-98 %] 98 % (09/24 1402) Weight change:  Last BM Date: 12/18/15  Intake/Output from previous day: 09/23 0701 - 09/24 0700 In: 920 [P.O.:920] Out: 2500 [Urine:2500]  Physical Exam General: No apparent distress   Supine in bed Lungs: Normal effort. Lungs clear to auscultation, no crackles or wheezes. Cardiovascular: Regular rate and rhythm, no edema Neurological: No new neurological deficits Wounds:  Clean, dry, intact. No signs of infection. B calves are bruised LLE is in a boot  Lab Results: BMET    Component Value Date/Time   NA 135 12/24/2015 0440   K 3.0 (L) 12/24/2015 0440   CL 96 (L) 12/24/2015 0440   CO2 31 12/24/2015 0440   GLUCOSE 110 (H) 12/24/2015 0440   BUN 5 (L) 12/24/2015 0440   CREATININE 0.59 (L) 12/24/2015 0440   CALCIUM 8.1 (L) 12/24/2015 0440   GFRNONAA >60 12/24/2015 0440   GFRAA >60 12/24/2015 0440   CBC    Component Value Date/Time   WBC 8.8 12/24/2015 0440   RBC 2.79 (L) 12/24/2015 0440   HGB 8.5 (L) 12/24/2015 0440   HCT 24.6 (L) 12/24/2015 0440   PLT 308 12/24/2015 0440   MCV 88.2 12/24/2015 0440   MCH 30.5 12/24/2015 0440   MCHC 34.6 12/24/2015 0440   RDW 13.5 12/24/2015 0440   LYMPHSABS 1.2 12/24/2015 0440   MONOABS 0.9 12/24/2015 0440   EOSABS 0.3 12/24/2015 0440   BASOSABS 0.1 12/24/2015 0440   CBG's (last 3):  No results for input(s): GLUCAP in the last 72 hours. LFT's Lab Results  Component Value Date   ALT 61 12/24/2015   AST 75 (H) 12/24/2015   ALKPHOS 79 12/24/2015   BILITOT 2.5 (H) 12/24/2015    Studies/Results: No results  found.  Medications:  I have reviewed the patient's current medications. Scheduled Medications: . bacitracin   Topical BID  . docusate sodium  100 mg Oral BID  . enoxaparin (LOVENOX) injection  30 mg Subcutaneous Q12H  . polyethylene glycol  17 g Oral BID  . traMADol  50 mg Oral QID   PRN Medications: acetaminophen, acetaminophen, alum & mag hydroxide-simeth, bisacodyl, diphenhydrAMINE, guaiFENesin-dextromethorphan, ipratropium-albuterol, methocarbamol, ondansetron **OR** ondansetron (ZOFRAN) IV, oxyCODONE, prochlorperazine **OR** prochlorperazine **OR** prochlorperazine, sodium phosphate, traZODone  Assessment/Plan: Principal Problem:   Closed bilateral ankle fractures Active Problems:   Multiple closed pelvic fractures with disruption of pelvic circle (HCC)   Hyponatremia   Hypokalemia Medical Problem List and Plan: 1.  Functional and mobility deficits secondary to right bimalleolar ankle fracture, multiple pelvic fractures s/p surgical fixation. Continue CIR as ongoing 2.  DVT Prophylaxis/Anticoagulation: Pharmaceutical: Lovenox 3. Pain Management: Will continue oxycodone prn. Schedule tramadol for more consist pain control . Improved last PM with addition of Tylenol! 4. Mood: team to provide ego support. LCSW to follow for evaluation and support.  5. Neuropsych: This patient is capable of making decisions on his own behalf. 6. Skin/Wound Care: Routine pressure relief measures. Monitor incisions for healing.  7. Fluids/Electrolytes/Nutrition: Monitor I/O. Offer supplements between meals. 8. ABLA:  Will add iron supplement.  9. Open right ankle  fracture s/p I &D with ORIF : Complete prophylactic antibiotic 9/17-9/21. TTWB for transfers only.  10. Bilateral unstable pelvic fractures s/p Bilateral SI screw fixation: NWB for 3 months? --will need prolonged anticoagulation per ortho. 11.Hypokalemia: Will continue supplement and recheck   12. Hyponatremia: Likely dilutional --encourage  intake.  13. Mild leucocytosis: Encourage IS. Monitor for signs of infection.  14. Constipation:miralax bid.   supp PR prn.  15. Urinary retention: Has required I/O caths. Will check UA/UCS.   Cont current Rx   Length of stay, days: 2   Valerie A. Felicity CoyerLeschber, MD 12/25/2015, 8:01 PM

## 2015-12-26 ENCOUNTER — Inpatient Hospital Stay (HOSPITAL_COMMUNITY): Payer: Medicaid Other | Admitting: Occupational Therapy

## 2015-12-26 ENCOUNTER — Inpatient Hospital Stay (HOSPITAL_COMMUNITY): Payer: Medicaid Other

## 2015-12-26 DIAGNOSIS — D62 Acute posthemorrhagic anemia: Secondary | ICD-10-CM

## 2015-12-26 LAB — BASIC METABOLIC PANEL
ANION GAP: 6 (ref 5–15)
BUN: 6 mg/dL (ref 6–20)
CHLORIDE: 101 mmol/L (ref 101–111)
CO2: 28 mmol/L (ref 22–32)
Calcium: 8.3 mg/dL — ABNORMAL LOW (ref 8.9–10.3)
Creatinine, Ser: 0.57 mg/dL — ABNORMAL LOW (ref 0.61–1.24)
GFR calc non Af Amer: >60 mL/min (ref >60)
Glucose, Bld: 103 mg/dL — ABNORMAL HIGH (ref 65–99)
POTASSIUM: 3.6 mmol/L (ref 3.5–5.1)
SODIUM: 135 mmol/L (ref 135–145)

## 2015-12-26 MED ORDER — POLYSACCHARIDE IRON COMPLEX 150 MG PO CAPS
150.0000 mg | ORAL_CAPSULE | Freq: Two times a day (BID) | ORAL | Status: DC
  Administered 2015-12-26 – 2015-12-29 (×6): 150 mg via ORAL
  Filled 2015-12-26 (×6): qty 1
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
## 2015-12-26 NOTE — Progress Notes (Signed)
Occupational Therapy Session Note  Patient Details  Name: Lance SabalDavid Rivera MRN: [id number] Date of Birth: [birth year]  Today's Date: 12/26/2015 OT Individual Time: 0800-0900 OT Individual Time Calculation (min): 60 min     Short Term Goals: Week 1:  OT Short Term Goal 1 (Week 1): Pt will complete toilet transfer with min assistance to bed side commode. OT Short Term Goal 2 (Week 1): Pt will complete lower body dressing with moderate assistance while maintaining weight bearing precautions. OT Short Term Goal 3 (Week 1): Pt will self-propel wheelchair to therapy gym with supervision.  Skilled Therapeutic Interventions/Progress Updates:     OT focused on transfers, and ADL.  Pt bathed LB with HOB at 40 degrees.  Provided stretching exercises to facilitate pt reaching Lower legs but pt too tight in legs and back.    Pt transferred to wc with the A/P approach with min assist.  Completed UB bathing and dressing in the wc  at the sink.  Attempted shaving, but beard too heavy for the effectiveness of the safety razor. Transferred back to bed and then to recliner for increased comfort.   Left pt in recliner with all needs in reach.   Therapy Documentation Precautions:  Precautions Precautions: Fall Required Braces or Orthoses: Other Brace/Splint Other Brace/Splint: CAM boot RLE Restrictions Weight Bearing Restrictions: Yes RLE Weight Bearing: Non weight bearing LLE Weight Bearing: Non weight bearing General:   Vital Signs: Therapy Vitals Temp: 97.9 F (36.6 C) Temp Source: Oral Pulse Rate: (!) 102 Resp: 16 BP: 121/71 Patient Position (if appropriate): Lying Oxygen Therapy SpO2: 97 % O2 Device: Not Delivered Pain: Pain Assessment Pain Assessment: 0-10 Pain Score: 3  Pain Type: Acute pain Pain Location: Ankle Pain Orientation: Right     See Function Navigator for Current Functional Status.   Therapy/Group: Individual Therapy  Humberto Sealsdwards, Fatima Fedie J 12/26/2015, 5:20 PM

## 2015-12-26 NOTE — Progress Notes (Signed)
Occupational Therapy Session Note  Patient Details  Name: Allyson SabalDavid Haydel MRN: [id number] Date of Birth: 07-Jan-1976  Today's Date: 12/26/2015 OT Individual Time: 1301-1400 OT Individual Time Calculation (min): 59 min     Short Term Goals: Week 1:  OT Short Term Goal 1 (Week 1): Pt will complete toilet transfer with min assistance to bed side commode. OT Short Term Goal 2 (Week 1): Pt will complete lower body dressing with moderate assistance while maintaining weight bearing precautions. OT Short Term Goal 3 (Week 1): Pt will self-propel wheelchair to therapy gym with supervision.  Skilled Therapeutic Interventions/Progress Updates:    Upon entering the room, pt reports he just got back into the bed from wheelchair. Pt reports 5/10 pain in B LE's. He declined OOB OT intervention this session but agreeable to therapeutic exercise. OT demonstrated B UE strengthening exercises with level 3, green theraband. Pt returned demonstrations with min verbal cues for proper technique for chest pulls, bicep curls, alternating punches, shoulder flexion, and shoulder diagonals x 2 sets of 10 reps. Pt needing rest breaks secondary to muscle fatigue. Pt remained in bed with call bell and all needed items within reach upon exiting the room.   Therapy Documentation Precautions:  Precautions Precautions: Fall Required Braces or Orthoses: Other Brace/Splint Other Brace/Splint: CAM boot RLE Restrictions Weight Bearing Restrictions: Yes RLE Weight Bearing: Non weight bearing LLE Weight Bearing: Non weight bearing  See Function Navigator for Current Functional Status.   Therapy/Group: Individual Therapy  Lowella Gripittman, Cathalina Barcia L 12/26/2015, 7:40 PM
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
## 2015-12-26 NOTE — Progress Notes (Signed)
Patient information reviewed and entered into eRehab system by Einar Nolasco, RN, CRRN, PPS Coordinator.  Information including medical coding and functional independence measure will be reviewed and updated through discharge.    
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
## 2015-12-26 NOTE — Care Management Note (Signed)
[facility] Individual Statement of Services  Patient Name:  Lance SabalDavid Diffee  Date:  12/26/2015  [facility] the [facility].  Our goal is to provide you with an individualized program based on your diagnosis and situation, designed to meet your specific needs.  With this comprehensive rehabilitation program, you will be expected to participate in at least 3 hours of rehabilitation therapies Monday-Friday, with modified therapy programming on the weekends.  Your rehabilitation program will include the following services:  Physical Therapy (PT), Occupational Therapy (OT), 24 hour per day rehabilitation nursing, Case Management (Social Worker), Rehabilitation Medicine, Nutrition Services and Pharmacy Services  Weekly team conferences will be held on Wednesday to discuss your progress.  Your Social Worker will talk with you frequently to get your input and to update you on team discussions.  Team conferences with you and your family in attendance may also be held.  Expected length of stay: 7-10 days  Overall anticipated outcome: supervision set up  Depending on your progress and recovery, your program may change. Your Social Worker will coordinate services and will keep you informed of any changes. Your Social Worker's name and contact numbers are listed  below.  The following services may also be recommended but are not provided by the [facility]:   Driving Evaluations  Home Health Rehabiltiation Services  Outpatient Rehabilitation Services  Vocational Rehabilitation   Arrangements will be made to provide these services after discharge if needed.  Arrangements include referral to agencies that provide these services.  Your insurance has been verified to be:  None Your primary doctor is:  None  Pertinent information will be shared with your doctor and your insurance company.  Social Worker:  Dossie DerBecky Evon Lopezperez, SW [phone] or ([phone])  [phone]  Information discussed with and copy given to patient by: Lucy Chrisupree, Jeanita Carneiro G, 12/26/2015, 10:01 AM

## 2015-12-27 ENCOUNTER — Inpatient Hospital Stay (HOSPITAL_COMMUNITY): Payer: Medicaid Other | Admitting: Occupational Therapy

## 2015-12-27 ENCOUNTER — Inpatient Hospital Stay (HOSPITAL_COMMUNITY): Payer: Self-pay | Admitting: Physical Therapy

## 2015-12-27 NOTE — Progress Notes (Signed)
Physical Therapy Note  Patient Details  Name: Lance SabalDavid Morones MRN: [id number] Date of Birth: [birth year] Today's Date: 12/27/2015    Time: 730-825 55 minutes  1:1 Pt c/o "soreness" but no pain.  A/P transfers throughout session with set up assist only, pt needs no cuing for safe transfer. Pt performed w/c mobility >500' and up/down incline with supervision. Pt supervision with w/c parts management.  Supine therex for LE and core strengthening. Pt fatigues easily with core strengthening, needs continued work on core activation.  Pt requires AAROM for Rt LE due to fatigue and some pain with therex.  Pt very motivated to work hard to return home quickly to his family.   DONAWERTH,KAREN 12/27/2015, 8:26 AM
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
## 2015-12-27 NOTE — Progress Notes (Signed)
Occupational Therapy Session Note  Patient Details  Name: Lance SabalDavid Rivera MRN: [id number] Date of Birth: 12-11-1975  Today's Date: 12/27/2015 OT Individual Time: [id number] OT Individual Time Calculation (min): 59 min     Short Term Goals: Week 1:  OT Short Term Goal 1 (Week 1): Pt will complete toilet transfer with min assistance to bed side commode. OT Short Term Goal 2 (Week 1): Pt will complete lower body dressing with moderate assistance while maintaining weight bearing precautions. OT Short Term Goal 3 (Week 1): Pt will self-propel wheelchair to therapy gym with supervision.  Skilled Therapeutic Interventions/Progress Updates:    Pt was able to transfer from the bed to the wheelchair with supervision using sliding board.  Lance Rivera then rolled himself down to the tub/shower room with supervision for practice with shower tub transfers using a tub bench.  Close supervision with mod instructional cueing for board placement to complete transfer.  Lance Rivera also transferred to the elevated toilet with use of the sliding board as well.  Clothing management and hygiene completed sitting on the toilet with lateral leans as well.  Transitioned back to the room for practice with drop arm commode and for transfer back to the EOB.  Pt finished washing per area and then donned new underpants once back in the bed.  Finished session with pt transferring to supine with call button and phone in reach.   Therapy Documentation Precautions:  Precautions Precautions: Fall Required Braces or Orthoses: Other Brace/Splint Other Brace/Splint: CAM boot RLE Restrictions Weight Bearing Restrictions: Yes RLE Weight Bearing: Non weight bearing LLE Weight Bearing: Non weight bearing  Pain: Pain Assessment Pain Assessment: 0-10 Pain Score: 3  Pain Type: Acute pain Pain Location: Ankle Pain Descriptors / Indicators: Aching Pain Intervention(s): Medication (See eMAR);RN made aware;Repositioned ADL: See Function Navigator for  Current Functional Status.   Therapy/Group: Individual Therapy  Lakishia Bourassa OTR/L 12/27/2015, 4:20 PM

## 2015-12-27 NOTE — Progress Notes (Signed)
Occupational Therapy Session Note  Patient Details  Name: Lance SabalDavid Rivera MRN: [id number] Date of Birth: October 24, 1975  Today's Date: 12/27/2015 OT Individual Time: [id number] OT Individual Time Calculation (min): 73 min     Short Term Goals: Week 1:  OT Short Term Goal 1 (Week 1): Pt will complete toilet transfer with min assistance to bed side commode. OT Short Term Goal 2 (Week 1): Pt will complete lower body dressing with moderate assistance while maintaining weight bearing precautions. OT Short Term Goal 3 (Week 1): Pt will self-propel wheelchair to therapy gym with supervision.  Skilled Therapeutic Interventions/Progress Updates:    Pt worked on bathing and dressing in sitting from EOB.  Pt completed LB self care in sitting with lateral leans.  Max assist for removal of CAM boot for washing as well as for donning them.  Pt needed rest breaks during session to prop the RLE back up in the bed to relieve pain.  Completed transfer via sliding board to wheelchair with supervision and min instructional cueing.  Transfer back to the bed at end of session at the same level.  Pt left with call button in reach.  Therapy Documentation Precautions:  Precautions Precautions: Fall Required Braces or Orthoses: Other Brace/Splint Other Brace/Splint: CAM boot RLE Restrictions Weight Bearing Restrictions: Yes RLE Weight Bearing: Non weight bearing LLE Weight Bearing: Non weight bearing  Pain: Pain Assessment Pain Assessment: 0-10 Pain Score: 3  ADL: See Function Navigator for Current Functional Status.   Therapy/Group: Individual Therapy  Winfrey Chillemi OTR/L 12/27/2015, 12:25 PM

## 2015-12-27 NOTE — Progress Notes (Signed)
Occupational Therapy Session Note  Patient Details  Name: Lance SabalDavid Santone MRN: [id number] Date of Birth: 1975/09/27  Today's Date: 12/27/2015 OT Individual Time: 0830-0900 OT Individual Time Calculation (min): 30 min     Short Term Goals:Week 1:  OT Short Term Goal 1 (Week 1): Pt will complete toilet transfer with min assistance to bed side commode. OT Short Term Goal 2 (Week 1): Pt will complete lower body dressing with moderate assistance while maintaining weight bearing precautions. OT Short Term Goal 3 (Week 1): Pt will self-propel wheelchair to therapy gym with supervision.  Skilled Therapeutic Interventions/Progress Updates:    Pt seen for skilled OT to facilitate UE strength and LE flexibility to improve transfer skills.  Pt received in w/c with feet elevated on arm chair. Pt worked on sustained hamstring stretching for 10 sec for 10 time. Green theraband exercises for upper and mid back strength of lat pull downs, rows, and horizontal abduction. tricep extensions. Pt felt fatigued after exercises, encouraged to rest prior to next session.   Therapy Documentation Precautions:  Precautions Precautions: Fall Required Braces or Orthoses: Other Brace/Splint Other Brace/Splint: CAM boot RLE Restrictions Weight Bearing Restrictions: (P) Yes RLE Weight Bearing: (P) Non weight bearing LLE Weight Bearing: (P) Non weight bearing  Vital Signs: Therapy Vitals Temp: 98.1 F (36.7 C) Temp Source: Oral Pulse Rate: 93 Resp: 18 BP: 124/77 Patient Position (if appropriate): Lying Oxygen Therapy SpO2: 96 % O2 Device: Not Delivered   Pain: 8/10 pain in low back, premedicated   ADL:    See Function Navigator for Current Functional Status.   Therapy/Group: Individual Therapy  SAGUIER,JULIA 12/27/2015, 8:28 AM

## 2015-12-28 ENCOUNTER — Inpatient Hospital Stay (HOSPITAL_COMMUNITY): Payer: Medicaid Other | Admitting: Occupational Therapy

## 2015-12-28 ENCOUNTER — Inpatient Hospital Stay (HOSPITAL_COMMUNITY): Payer: Medicaid Other

## 2015-12-28 ENCOUNTER — Inpatient Hospital Stay (HOSPITAL_COMMUNITY): Payer: Medicaid Other | Admitting: *Deleted
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
## 2015-12-28 NOTE — Progress Notes (Signed)
Occupational Therapy Session Note  Patient Details  Name: Lance SabalDavid Aveni MRN: [id number] Date of Birth: Jul 08, 1975  Today's Date: 12/28/2015 OT Individual Time: 1135-1210 OT Individual Time Calculation (min): 35 min     Short Term Goals: Week 1:  OT Short Term Goal 1 (Week 1): Pt will complete toilet transfer with min assistance to bed side commode. OT Short Term Goal 2 (Week 1): Pt will complete lower body dressing with moderate assistance while maintaining weight bearing precautions. OT Short Term Goal 3 (Week 1): Pt will self-propel wheelchair to therapy gym with supervision.  Skilled Therapeutic Interventions/Progress Updates:    Treatment session with focus on family education regarding bathroom transfers and home access.  Pt received in bed, transferred bed > w/c with use of slide board with supervision.  Pt setup slide board without cues or assist, requiring min cues for NWB status during transfer.  Educated pt's girlfriend on technique to bump w/c up one step into home with therapist demonstrating forward bumping technique (as well as positioning of anti tippers and leg rests).  Pt's girlfriend required physical assist to bump pt in w/c up one step as she demonstrated difficulty lifting chair once in tilted position.  2nd therapist assisted with positioning and support on side of w/c when bumping w/c down one step.  Provided pt and pt's girlfriend with written handout with pictures to assist with bumping w/c up/down steps.  Recommend having 2nd person assist with accessing home to which they both replied that they have neighbors and friends who can assist.  Performed tub/shower transfer with tub transfer bench and slide board with pt able to setup and perform with supervision only, cues for NWB status.  Returned to bed at end of session and left with RLE propped on pillow.  PT to further assess home entrance, recommend additional family education or alternative plan for home entry.  Therapy  Documentation Precautions:  Precautions Precautions: Fall Required Braces or Orthoses: Other Brace/Splint Other Brace/Splint: CAM boot RLE Restrictions Weight Bearing Restrictions: Yes RLE Weight Bearing: Non weight bearing LLE Weight Bearing: Non weight bearing Pain: Pain Assessment Pain Assessment: Faces Faces Pain Scale: Hurts even more Pain Intervention(s): Repositioned  See Function Navigator for Current Functional Status.   Therapy/Group: Individual Therapy  Rosalio LoudHOXIE, Vong Garringer 12/28/2015, 12:36 PM

## 2015-12-28 NOTE — Progress Notes (Signed)
Social Work Patient ID: Judie Petit, male   DOB: [birth year], 40 y.o.   MRN: [id number]  Met with pt, Aunt, Mom and girlfriend to discuss team conference goals-supervision level and discharge date 9/28.  They will stay until 11:30 to go to OT with him. Discussed equipment needs and follow up-PT doesn't want follow up until can WB. Discussed with applying for food stamps, and assistance due to financial issues. Girlfriend will go to [place] in [employer]. Will get Match program for medications and have referred to [facility] in [place]. Will work on discharge needs for tomorrow.

## 2015-12-28 NOTE — Progress Notes (Signed)
Occupational Therapy Discharge Summary  Patient Details  Name: Lance Rivera MRN: [id number] Date of Birth: 1975/11/27  Today's Date: 12/28/2015 OT Individual Time: 1135-1210 OT Individual Time Calculation (min): 35 min   Session Note:  Pt completed bathing and dressing sitting EOB with lateral leans for washing peri area and returning to supine with rolling in order to pull shorts up over hips.  He was able to transfer from supine to sit during session with close supervision without use of bed rails or need for assistance.  Needed one rest break for relief of pain in the RLE by propping the LE up on the bed.  Pain meds also brought by nursing.  Left in bed at end of session with call button in reach.       Patient has met 6 of 6 long term goals due to improved activity tolerance and ability to compensate for deficits.  Patient to discharge at overall Supervision level.  Patient's care partner is independent to provide the necessary physical and cognitive assistance at discharge.    Reasons goals not met: NA  Recommendation:  Patient will benefit from ongoing skilled OT services in home health setting to continue to advance functional skills in the area of BADL.  Equipment: drop arm commode, tub bench  Reasons for discharge: treatment goals met and discharge from hospital  Patient/family agrees with progress made and goals achieved: Yes  OT Discharge Precautions/Restrictions Precautions Precautions: Fall Required Braces or Orthoses: Other Brace/Splint Other Brace/Splint: CAM boot RLE Restrictions Weight Bearing Restrictions: Yes RLE Weight Bearing: Non weight bearing LLE Weight Bearing: Non weight bearing   Pain Pain Assessment Pain Assessment: Faces Faces Pain Scale: Hurts even more Pain Intervention(s): Repositioned ADL  See Function Section of chart for details  Vision/Perception  Vision- History Baseline Vision/History: No visual deficits Vision- Assessment Vision  Assessment?: No apparent visual deficits  Cognition Overall Cognitive Status: Within Functional Limits for tasks assessed Arousal/Alertness: Awake/alert Orientation Level: Oriented X4 Memory: Appears intact Awareness: Appears intact Problem Solving: Appears intact Safety/Judgment: Impaired Comments: Pt at times putting weight through LEs during scooting transitions on EOB. Sensation Sensation Light Touch: Appears Intact Stereognosis: Appears Intact Hot/Cold: Appears Intact Proprioception: Appears Intact Coordination Gross Motor Movements are Fluid and Coordinated: Yes Fine Motor Movements are Fluid and Coordinated: Yes Motor  Motor Motor: Within Functional Limits Mobility  Bed Mobility Bed Mobility: Supine to Sit;Sit to Supine Supine to Sit: 5: Supervision;HOB flat Sit to Supine: 5: Supervision;HOB flat  Trunk/Postural Assessment  Cervical Assessment Cervical Assessment: Within Functional Limits Thoracic Assessment Thoracic Assessment: Within Functional Limits Lumbar Assessment Lumbar Assessment: Within Functional Limits Postural Control Postural Control: Within Functional Limits  Balance Static Sitting Balance Static Sitting - Balance Support: No upper extremity supported Static Sitting - Level of Assistance: 7: Independent Dynamic Sitting Balance Dynamic Sitting - Balance Support: During functional activity Dynamic Sitting - Level of Assistance: 6: Modified independent (Device/Increase time) Extremity/Trunk Assessment RUE Assessment RUE Assessment: Within Functional Limits LUE Assessment LUE Assessment: Within Functional Limits   See Function Navigator for Current Functional Status.  Marny Smethers OTR/L 12/28/2015, 4:03 PM
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
## 2015-12-28 NOTE — Progress Notes (Addendum)
Physical Therapy Session Note  Patient Details  Name: Lance Rivera MRN: [id number] Date of Birth: [birth year]  Today's Date: 12/28/2015 PT Individual Time: [id number]; 1415- 1450 PT Individual Time Calculation (min): 75 min , 35 min Short Term Goals: Week 1:  PT Short Term Goal 1 (Week 1): =LTGs due to ELOS  Skilled Therapeutic Interventions/Progress Updates:  tx 1:  pain R ankle rated 2/10.  Bed mobility using bedrail in flat bed with supervision. Slide board transfer to L bed> w/c, level with supervision, safely without cues.  Pt manipulated leg rests with min assist.  He stated his GF will be managing them for him.  Ramp has not been installed yet; pt stated his uncle could probably build it this weekend; PT informed him his GF needs to come in for training in managing w/c up/down a step to enter house.  W/c propulsion distant superviision to/from therapy.  Therapeutic exercise performed with LE to increase strength for functional mobility: seated Scifit backwards, at level 4.0 x 5 minutes; supine 20 x 1 each: L/R short arc quad knee ext, L ankle pumps, bil hip internal rotattion, modified abdominal crunches, 10 x 1 each assisted R/L heel slides.  VCs to count aloud to prevent Valsalva maneuver.  Pt left resting in w/c with  all needs within reach.    Discussed with CSW, pt's aunt and GF set up of home entry.  Pt has a high step ground> porch, then low step porch> house,and steep and uneven approach to house, per aunt.  No family or friends are available to physically assist GF to manage pt in/out of house.  CSW is pursuing medical transport to get pt into house.  His uncle may be able to build some type of ramp this weekend.   tx 2:  Pain 2/10 L ankle  Pt transferred into personal w/c; leg rests adjusted. W/c propulsion modified independent except for leg rest manipulation.  [facility] using slide board with cues for remembering armrest. Returned to bed with supervision.         Therapy Documentation Precautions:  Precautions Precautions: Fall Required Braces or Orthoses: Other Brace/Splint Other Brace/Splint: CAM boot RLE Restrictions Weight Bearing Restrictions: Yes RLE Weight Bearing: Non weight bearing LLE Weight Bearing: Non weight bearing  Pain: Pain Assessment Pain Assessment: 0-10 Pain Score: 2 Pain Location: Ankle Pain Orientation: Right Pain Intervention(s): Medication (See eMAR)     See Function Navigator for Current Functional Status.   Therapy/Group: Individual Therapy  Agripina Guyette 12/28/2015, 9:05 AM

## 2015-12-29 ENCOUNTER — Inpatient Hospital Stay (HOSPITAL_COMMUNITY): Payer: Self-pay

## 2015-12-29 MED ORDER — POLYSACCHARIDE IRON COMPLEX 150 MG PO CAPS: 150.0000 mg | ORAL_CAPSULE | Freq: Two times a day (BID) | ORAL | 0 refills | Status: DC

## 2015-12-29 MED ORDER — DOCUSATE SODIUM 100 MG PO CAPS: 100.0000 mg | ORAL_CAPSULE | Freq: Two times a day (BID) | ORAL | 0 refills | Status: DC

## 2015-12-29 MED ORDER — METHOCARBAMOL 500 MG PO TABS: 500.0000 mg | ORAL_TABLET | Freq: Four times a day (QID) | ORAL | 0 refills | Status: DC | PRN

## 2015-12-29 MED ORDER — ENOXAPARIN SODIUM 30 MG/0.3ML ~~LOC~~ SOLN: 30.0000 mg | Freq: Two times a day (BID) | SUBCUTANEOUS | 0 refills | Status: DC

## 2015-12-29 MED ORDER — TRAMADOL HCL 50 MG PO TABS: 50.0000 mg | ORAL_TABLET | Freq: Four times a day (QID) | ORAL | 0 refills | Status: DC

## 2015-12-29 MED ORDER — OXYCODONE HCL 5 MG PO TABS: 5.0000 mg | ORAL_TABLET | Freq: Three times a day (TID) | ORAL | 0 refills | Status: DC | PRN

## 2015-12-29 MED ORDER — ENOXAPARIN (LOVENOX) PATIENT EDUCATION KIT
PACK | Freq: Once | Status: DC
  Filled 2015-12-29: qty 1

## 2015-12-29 MED ORDER — POLYETHYLENE GLYCOL 3350 17 G PO PACK: 17.0000 g | PACK | Freq: Two times a day (BID) | ORAL | 0 refills | Status: DC
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
## 2015-12-29 NOTE — Progress Notes (Addendum)
Social Work Patient ID: Lance Rivera, male   DOB: [birth year], 40 y.o.   MRN: [id number] Equipment delivered and pt still working on transfers and options for getting into home with one step and one step onto the porch.  Aware of option to go by ambulance but will have the bill. He wants to have uncle come and take him home. Aunt sent pictures to PT so can see how high the steps are. [facility] due to no qualifying diagnosis for HHPT. [facility] he is aware of this and will do this once at home.

## 2015-12-29 NOTE — Discharge Instructions (Signed)
Inpatient Rehab Discharge Instructions  Allyson SabalDavid Peine Discharge date and time: 12/29/15   Activities/Precautions/ Functional Status: Activity: activity as tolerated--absolutely no walking Diet: regular diet Wound Care: wash incision with soap and water. Pat dry and apply dry dressing if drainage occurs. Contact MD if you develop any problems with your incision/wound--redness, swelling, increase in pain, drainage or if you develop fever or chills.    Functional status:  ___ No restrictions     ___ Walk up steps independently __X_ 24/7 supervision/assistance   ___ Walk up steps with assistance ___ Intermittent supervision/assistance  ___ Bathe/dress independently ___ Walk with walker     _X__ Bathe/dress with assistance ___ Walk Independently    ___ Shower independently ___ Walk with assistance    ___ Shower with assistance _X__ No alcohol     ___ Return to work/school ________  Special Instructions: 1. No cigarettes or tobacco products--this delays/harms healing process.  5. Use ice on and off for 15 minutes at a time for pain relief.     COMMUNITY REFERRALS UPON DISCHARGE:    Home Health:   RN & SW  Agency:[facility] [phone]   Date of last service:12/29/2015  Medical Equipment/Items Ordered:WHEELCHAIR, DROP-ARM BEDSIDE COMMODE &TUB BENCH  Agency/Supplier:[facility]  [phone] Other:[place] [employer] TO APPLY FOR FOOD STAMPS AND FINANCIAL RESOURCES APPLYING FOR DISABILITY AND MEDICAID-GIRLFRIEND PURSUING  My questions have been answered and I understand these instructions. I will adhere to these goals and the provided educational materials after my discharge from the hospital.  Patient/Caregiver Signature _______________________________ Date __________  Clinician Signature _______________________________________ Date __________  Please bring this form and your medication list with you to all your follow-up doctor's appointments.

## 2015-12-29 NOTE — Progress Notes (Signed)
Patient discharged home.  Left floor via wheelchair, escorted by nursing staff and family.  Patient and family verbalized understanding of discharge instructions as given by Marissa NestlePam Love, PA.  Patients aunt verbalized comfort in administering Lovenox injections to patient BID once home and comfort in performing dressing changes as we have here in the hospital.  Dressing supplies sent with patient.  All patient belongings sent with patient, including DME.  Appears to be in no immediate distress at this time.  Dani Gobbleeardon, Anasia Agro J, RN

## 2015-12-29 NOTE — Progress Notes (Signed)
Social Work Discharge Note  The overall goal for the admission was met for:   Discharge location: Yes-home with girlfriend and family members to assist  Length of Stay: Yes-6 days  Discharge activity level: Yes-supervision wheelchair level due to NWB status  Home/community participation: Yes  Services provided included: MD, RD, PT, OT, RN, CM, Pharmacy and [place]: Other: pending medicaid  Follow-up services arranged: Home Health: [facility]-rn & sw, DME: [facility]-wheelchair, drop-arm bedside commode and tub bench and Patient/Family has no preference for HH/DME agencies  Comments (or additional information):girlfriend, aunt and  Mom here yesterday for education, uncle to assist with getting him into the home. Numerous concerns re: finances. Aware to go to [place] to apply for assistance. Match prescription program given to pt. Girlfriend has applied for disability and medicaid for pt. Pt confident he will do well at home. Aware to follow up at [facility] for PCP and conitnued medication assistance.  Patient/Family verbalized understanding of follow-up arrangements: Yes  Individual responsible for coordination of the follow-up plan: sarah-girlfriend  Confirmed correct DME delivered: Elease Hashimoto 12/29/2015    Elease Hashimoto

## 2015-12-29 NOTE — Progress Notes (Signed)
Physical Therapy Discharge Summary  Patient Details  Name: Lance Rivera MRN: [id number] Date of Birth: 1975-09-22  Today's Date: 12/29/2015 PT Individual Time: 0800-0830 PT Individual Time Calculation (min): 30 min   Patient has met 7 of 9 long term goals due to improved activity tolerance, increased strength, decreased pain, ability to compensate for deficits and functional use of  right lower extremity and left lower extremity.  Patient to discharge at a wheelchair level set-up for transfers for leg rests/ Modified Independent propulsion.   Patient's care partner requires assistance of another family member to provide the necessary physical assistance to manage w/c up/down steps to enter house at discharge.  Reasons goals not met: pt needs set-up for leg rests during transfers  Recommendation:  Patient will benefit from ongoing skilled PT services in home health setting to continue to advance safe functional mobility, address ongoing impairments in strength, pain, flexibility, weight bearing when allowed , and minimize fall risk.  Equipment: w/c with basic cushion and elevating legrests; slide board  Reasons for discharge: treatment goals met and discharge from hospital  Patient/family agrees with progress made and goals achieved: Yes  PT Discharge Precautions/Restrictions Precautions Precautions: Fall Required Braces or Orthoses: Other Brace/Splint Other Brace/Splint: CAM boot RLE Restrictions Weight Bearing Restrictions: Yes RLE Weight Bearing: Non weight bearing LLE Weight Bearing: Non weight bearing Vital Signs Therapy Vitals Temp: 98.3 F (36.8 C) Temp Source: Oral Pulse Rate: 82 Resp: 16 BP: 116/65 Patient Position (if appropriate): Lying Oxygen Therapy SpO2: 98 % O2 Device: Not Delivered Pain Pain Assessment Pain Assessment: 0-10 Pain Score: 4  Pain Type: Acute pain Pain Location: Ankle Pain Orientation: Right Pain Descriptors / Indicators: Aching Pain  Onset: With Activity Patients Stated Pain Goal: 2 Pain Intervention(s): Medication (See eMAR) Vision/Perception - no changes    Cognition- A and O x 4   Sensation Sensation Light Touch: Appears Intact Motor  Motor Motor: Within Functional Limits  Mobility Bed Mobility Supine to Sit: 6: Modified independent (Device/Increase time) Sit to Supine: 6: Modified independent (Device/Increase time) Transfers Transfers: Yes Anterior-Posterior Transfer: 5: Supervision;Other (comment) Anterior-Posterior Transfer Details: Other (Comment) (set up for leg rests) Lateral/Scoot Transfers: 5: Supervision Lateral/Scoot Transfer Details: Other (comment) (set up for leg rests) Locomotion  Ambulation Ambulation: No Gait Gait: No Wheelchair Mobility Wheelchair Mobility: Yes Wheelchair Assistance: 6: Modified independent (Device/Increase time) Environmental health practitioner: Both upper extremities Wheelchair Parts Management: Needs assistance Distance: 150  Trunk/Postural Assessment  Cervical Assessment Cervical Assessment: Within Functional Limits Thoracic Assessment Thoracic Assessment: Within Functional Limits Lumbar Assessment Lumbar Assessment: Within Functional Limits Postural Control Postural Control: Within Functional Limits  Balance Static Sitting Balance Static Sitting - Balance Support: No upper extremity supported Dynamic Sitting Balance Dynamic Sitting - Balance Support: During functional activity Dynamic Sitting - Level of Assistance: 6: Modified independent (Device/Increase time) Extremity Assessment      RLE Strength RLE Overall Strength Comments: at least 3/5 hip flex, knee ext, toe ext LLE Strength LLE Overall Strength Comments: at least 3/5 hip flex, knee ext, ankle DF   See Function Navigator for Current Functional Status.  Albirta Rhinehart 12/29/2015, 8:46 AM
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
## 2015-12-30 MED FILL — ENOXAPARIN 30 MG/0.3 ML SYR: 30 | 30 days supply | Qty: 18 | Fill #0

## 2016-01-02 ENCOUNTER — Telehealth: Payer: Self-pay | Admitting: Physical Medicine & Rehabilitation

## 2016-01-02 NOTE — Telephone Encounter (Signed)
Lance Rivera with [facility] needs to get verbal orders for SN to continue.  Please call her at [phone].

## 2016-01-02 NOTE — Telephone Encounter (Signed)
2wk4,1wk4 approval given

## 2016-01-05 ENCOUNTER — Encounter: Payer: Self-pay | Admitting: Physical Medicine & Rehabilitation

## 2016-01-05 ENCOUNTER — Telehealth: Payer: Self-pay | Admitting: Physical Medicine & Rehabilitation

## 2016-01-05 MED ORDER — ACETAMINOPHEN-CODEINE #4 300-60 MG PO TABS: ORAL_TABLET | ORAL | 0 refills | Status: DC

## 2016-01-05 NOTE — Telephone Encounter (Signed)
Call in Tylenol #4 1-2 po TID prn, #42 no refill Please check N[facility]SR to see if ortho has prescribed Narcotics

## 2016-01-05 NOTE — Telephone Encounter (Signed)
Only medications from our providers on NCCSR.  Tylenol #4 called to pharmacy with instructions to tell pt to bring bottle to appt with Kirsteins.  Mr Lance Rivera mother notified Rx called in.

## 2016-01-05 NOTE — Telephone Encounter (Signed)
Patient will be out of medication (oxycodone) before his hospital follow up appointment.

## 2016-01-12 ENCOUNTER — Encounter: Payer: Self-pay | Admitting: Physical Medicine & Rehabilitation

## 2016-01-12 ENCOUNTER — Ambulatory Visit (HOSPITAL_BASED_OUTPATIENT_CLINIC_OR_DEPARTMENT_OTHER): Payer: Medicaid Other | Admitting: Physical Medicine & Rehabilitation

## 2016-01-12 ENCOUNTER — Encounter: Payer: No Typology Code available for payment source | Attending: Physical Medicine & Rehabilitation

## 2016-01-12 VITALS — BP 111/80 | HR 89

## 2016-01-12 DIAGNOSIS — S32810D Multiple fractures of pelvis with stable disruption of pelvic ring, subsequent encounter for fracture with routine healing: Secondary | ICD-10-CM | POA: Insufficient documentation

## 2016-01-12 DIAGNOSIS — F17200 Nicotine dependence, unspecified, uncomplicated: Secondary | ICD-10-CM | POA: Diagnosis not present

## 2016-01-12 DIAGNOSIS — Z993 Dependence on wheelchair: Secondary | ICD-10-CM | POA: Insufficient documentation

## 2016-01-12 DIAGNOSIS — Z9889 Other specified postprocedural states: Secondary | ICD-10-CM | POA: Diagnosis not present

## 2016-01-12 DIAGNOSIS — G8918 Other acute postprocedural pain: Secondary | ICD-10-CM | POA: Diagnosis not present
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
## 2016-01-12 NOTE — Patient Instructions (Signed)
Follow up with Dr Carola FrostHandy Dr Lajoyce Cornersuda Establish with  Primary care doctor

## 2016-01-16 ENCOUNTER — Inpatient Hospital Stay (INDEPENDENT_AMBULATORY_CARE_PROVIDER_SITE_OTHER): Payer: Self-pay | Admitting: Orthopedic Surgery

## 2016-01-17 DIAGNOSIS — S32502D Unspecified fracture of left pubis, subsequent encounter for fracture with routine healing: Secondary | ICD-10-CM | POA: Diagnosis not present

## 2016-01-17 DIAGNOSIS — S82841D Displaced bimalleolar fracture of right lower leg, subsequent encounter for closed fracture with routine healing: Secondary | ICD-10-CM | POA: Diagnosis not present

## 2016-01-17 DIAGNOSIS — S02401D Maxillary fracture, unspecified, subsequent encounter for fracture with routine healing: Secondary | ICD-10-CM | POA: Diagnosis not present

## 2016-01-19 ENCOUNTER — Ambulatory Visit (INDEPENDENT_AMBULATORY_CARE_PROVIDER_SITE_OTHER): Payer: Self-pay | Admitting: Orthopedic Surgery

## 2016-01-19 DIAGNOSIS — S82841E Displaced bimalleolar fracture of right lower leg, subsequent encounter for open fracture type I or II with routine healing: Secondary | ICD-10-CM

## 2016-01-20 MED FILL — DOXYCYCLINE HYCLATE 100 MG: 100 | 14 days supply | Qty: 28 | Fill #0

## 2016-01-24 ENCOUNTER — Telehealth (INDEPENDENT_AMBULATORY_CARE_PROVIDER_SITE_OTHER): Payer: Self-pay | Admitting: Radiology

## 2016-01-24 NOTE — Telephone Encounter (Signed)
[facility] at 4:39pm leaving a vm on the office phone wanting to get a clarification of wound care orders. She had previously been doing wet to dry dressing changes. Advised ok to use abx ointment dressing changes daily, dc wet to dry dressing per Barnie DelErin Zamora NP.

## 2016-02-01 ENCOUNTER — Telehealth (INDEPENDENT_AMBULATORY_CARE_PROVIDER_SITE_OTHER): Payer: Self-pay
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
## 2016-02-06 ENCOUNTER — Encounter (INDEPENDENT_AMBULATORY_CARE_PROVIDER_SITE_OTHER): Payer: Self-pay | Admitting: Orthopedic Surgery

## 2016-02-06 ENCOUNTER — Ambulatory Visit (INDEPENDENT_AMBULATORY_CARE_PROVIDER_SITE_OTHER): Payer: Medicaid Other

## 2016-02-06 ENCOUNTER — Ambulatory Visit (INDEPENDENT_AMBULATORY_CARE_PROVIDER_SITE_OTHER): Payer: Self-pay | Admitting: Orthopedic Surgery

## 2016-02-06 VITALS — Ht 72.0 in | Wt 211.0 lb

## 2016-02-06 DIAGNOSIS — M25571 Pain in right ankle and joints of right foot: Secondary | ICD-10-CM

## 2016-02-06 DIAGNOSIS — S82892E Other fracture of left lower leg, subsequent encounter for open fracture type I or II with routine healing: Secondary | ICD-10-CM
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
## 2016-02-16 ENCOUNTER — Telehealth: Payer: Self-pay | Admitting: Physical Medicine & Rehabilitation

## 2016-02-16 NOTE — Telephone Encounter (Signed)
patient is having issues with getting an appt with PT - patient is medicaid pending and had the assistance program through [facility] - [place] not see [place] without insurance. what [facility] Dr Wynn BankerKirsteins do to help in this matter? Dr Lajoyce Cornersuda or Dr Marcello FennelHande wrote the initial referral for the PT

## 2016-02-17 NOTE — Telephone Encounter (Signed)
Dr. Carola FrostHandy and Lajoyce Cornersuda need to address weight bearing precautions with physical therapy, PT can call their office to clarify

## 2016-02-17 NOTE — Telephone Encounter (Signed)
[facility]

## 2016-02-21 NOTE — Telephone Encounter (Signed)
The referral. We'll need to come from either Dr. Carola FrostHandy, or Dr. Lajoyce Cornersuda , so appropriate precautions can be communicated

## 2016-02-21 NOTE — Telephone Encounter (Signed)
Patient mother has been notified and will relay the message to [place]. Communicated to mother that the referral will need to come from either Dr. Carola FrostHandy, or Dr. Lajoyce Cornersuda , so the appropriate precautions can be communicated. Per Dr. Wynn BankerKirsteins Mother understood.

## 2016-02-21 NOTE — Telephone Encounter (Signed)
Do we still make the referral to [facility]?

## 2016-03-05 ENCOUNTER — Ambulatory Visit (INDEPENDENT_AMBULATORY_CARE_PROVIDER_SITE_OTHER): Payer: Medicaid Other

## 2016-03-05 ENCOUNTER — Telehealth (INDEPENDENT_AMBULATORY_CARE_PROVIDER_SITE_OTHER): Payer: Self-pay | Admitting: Orthopedic Surgery

## 2016-03-05 ENCOUNTER — Encounter (INDEPENDENT_AMBULATORY_CARE_PROVIDER_SITE_OTHER): Payer: Self-pay | Admitting: Orthopedic Surgery

## 2016-03-05 ENCOUNTER — Ambulatory Visit (INDEPENDENT_AMBULATORY_CARE_PROVIDER_SITE_OTHER): Payer: Self-pay

## 2016-03-05 ENCOUNTER — Ambulatory Visit (INDEPENDENT_AMBULATORY_CARE_PROVIDER_SITE_OTHER): Payer: Medicaid Other | Admitting: Orthopedic Surgery

## 2016-03-05 DIAGNOSIS — M25571 Pain in right ankle and joints of right foot: Secondary | ICD-10-CM

## 2016-03-05 DIAGNOSIS — S82891H Other fracture of right lower leg, subsequent encounter for open fracture type I or II with delayed healing: Secondary | ICD-10-CM

## 2016-03-05 DIAGNOSIS — M25572 Pain in left ankle and joints of left foot: Secondary | ICD-10-CM

## 2016-03-05 MED ORDER — DOXYCYCLINE HYCLATE 100 MG PO TABS: 100.0000 mg | ORAL_TABLET | Freq: Two times a day (BID) | ORAL | 0 refills | Status: DC

## 2016-03-05 MED ORDER — MUPIROCIN 2 % EX OINT: 1.0000 "application " | TOPICAL_OINTMENT | Freq: Two times a day (BID) | CUTANEOUS | 3 refills | Status: DC
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
## 2016-03-05 NOTE — Addendum Note (Signed)
Addended by: Aldean BakerUDA, [place] Wherry on: 03/05/2016 01:12 PM   Modules accepted: Orders

## 2016-03-06 ENCOUNTER — Telehealth (INDEPENDENT_AMBULATORY_CARE_PROVIDER_SITE_OTHER): Payer: Self-pay | Admitting: Orthopedic Surgery

## 2016-03-06 ENCOUNTER — Other Ambulatory Visit (INDEPENDENT_AMBULATORY_CARE_PROVIDER_SITE_OTHER): Payer: Self-pay | Admitting: Orthopedic Surgery

## 2016-03-06 MED ORDER — DOXYCYCLINE HYCLATE 100 MG PO TABS: 100.0000 mg | ORAL_TABLET | Freq: Two times a day (BID) | ORAL | 0 refills | Status: DC

## 2016-03-06 NOTE — Telephone Encounter (Signed)
[facility] check [facility] make [facility] [address] [facility] [place] Order was sent.

## 2016-03-06 NOTE — Telephone Encounter (Signed)
Tried calling and no answer

## 2016-03-06 NOTE — Telephone Encounter (Signed)
This pt was supposed to have an rx for doxy can you please enter this?

## 2016-03-06 NOTE — Telephone Encounter (Signed)
pt mom called to ask why pt rx was not at the pharmacy it is the oral Doxycycline. She states only the cream was there and pt wants both cream and oral rx. [facility] [address] [facility] [place]. Pt mom asked if you could call her if this is taken care of at [phone]

## 2016-03-06 NOTE — Telephone Encounter (Signed)
With patient's slow healing and persistent swelling would recommend compression use the fracture boot minimize activities follow-up as scheduled. Would not return to work until this is completely healed.

## 2016-03-06 NOTE — Telephone Encounter (Signed)
Pt called to see if this was an oral that was ordered, pt requesting call back

## 2016-03-07 NOTE — Telephone Encounter (Signed)
I called left voicemail on patients mother, Betty's phone, listed in his emergency contact and number provided for originally. Doxycycline has been sent into [facility] in [place], also advised per MD that due to patients persistent swelling, continue with a compression stocking, fx boot, follow up on scheduled appt, not to return to work until leg is completely healed.

## 2016-03-07 NOTE — Telephone Encounter (Signed)
I called left voicemail on patients mother, Betty's phone, listed in his emergency contact and number provided for originally. Doxycycline has been sent into [facility] in [place], also advised per MD that due to patients persistent swelling, continue with a compression stocking, fx boot, follow up on scheduled appt, not to return to work until leg is completely healed.  

## 2016-03-12 ENCOUNTER — Ambulatory Visit (INDEPENDENT_AMBULATORY_CARE_PROVIDER_SITE_OTHER): Payer: Self-pay | Admitting: Orthopedic Surgery

## 2016-03-14 ENCOUNTER — Telehealth (INDEPENDENT_AMBULATORY_CARE_PROVIDER_SITE_OTHER): Payer: Self-pay | Admitting: *Deleted

## 2016-03-14 NOTE — Telephone Encounter (Signed)
Pt girlfriend called stating disability told her to get in contact with us to see If we have received any paperwork. I advised her we have a new fax and to make sure they have that one.

## 2016-03-23 ENCOUNTER — Encounter (INDEPENDENT_AMBULATORY_CARE_PROVIDER_SITE_OTHER): Payer: Self-pay | Admitting: Orthopaedic Surgery

## 2016-03-23 ENCOUNTER — Ambulatory Visit (INDEPENDENT_AMBULATORY_CARE_PROVIDER_SITE_OTHER): Payer: Medicaid Other

## 2016-03-23 ENCOUNTER — Ambulatory Visit (INDEPENDENT_AMBULATORY_CARE_PROVIDER_SITE_OTHER): Payer: Medicaid Other | Admitting: Orthopaedic Surgery

## 2016-03-23 VITALS — BP 125/74 | Temp 97.6°F

## 2016-03-23 DIAGNOSIS — G8929 Other chronic pain: Secondary | ICD-10-CM

## 2016-03-23 DIAGNOSIS — M25571 Pain in right ankle and joints of right foot: Secondary | ICD-10-CM

## 2016-03-23 DIAGNOSIS — S82891H Other fracture of right lower leg, subsequent encounter for open fracture type I or II with delayed healing: Secondary | ICD-10-CM

## 2016-03-23 MED ORDER — DOXYCYCLINE HYCLATE 100 MG PO TABS: 100.0000 mg | ORAL_TABLET | Freq: Two times a day (BID) | ORAL | 0 refills | Status: DC

## 2016-03-23 MED ORDER — TRAMADOL HCL 50 MG PO TABS: 50.0000 mg | ORAL_TABLET | Freq: Four times a day (QID) | ORAL | 0 refills | Status: AC | PRN

## 2016-03-23 MED ORDER — MUPIROCIN 2 % EX OINT: 1.0000 "application " | TOPICAL_OINTMENT | Freq: Two times a day (BID) | CUTANEOUS | 3 refills | Status: AC

## 2016-03-23 NOTE — Patient Instructions (Signed)
Patient advised to elevate his foot above heart level as much as  possible to decrease swelling.  Must follow up with Dr. Lajoyce Cornersuda in 1 week for recheck of his wound
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
## 2016-03-30 ENCOUNTER — Ambulatory Visit (INDEPENDENT_AMBULATORY_CARE_PROVIDER_SITE_OTHER): Payer: Medicaid Other | Admitting: Orthopedic Surgery

## 2016-04-03 ENCOUNTER — Ambulatory Visit (INDEPENDENT_AMBULATORY_CARE_PROVIDER_SITE_OTHER): Payer: MEDICAID | Admitting: Orthopedic Surgery

## 2016-04-06 ENCOUNTER — Ambulatory Visit (INDEPENDENT_AMBULATORY_CARE_PROVIDER_SITE_OTHER): Payer: Medicaid Other | Admitting: Orthopedic Surgery

## 2016-04-06 ENCOUNTER — Ambulatory Visit (INDEPENDENT_AMBULATORY_CARE_PROVIDER_SITE_OTHER): Payer: Medicaid Other

## 2016-04-06 ENCOUNTER — Encounter (INDEPENDENT_AMBULATORY_CARE_PROVIDER_SITE_OTHER): Payer: Self-pay | Admitting: Orthopedic Surgery

## 2016-04-06 VITALS — Ht 72.0 in | Wt 211.0 lb

## 2016-04-06 DIAGNOSIS — T847XXA Infection and inflammatory reaction due to other internal orthopedic prosthetic devices, implants and grafts, initial encounter: Secondary | ICD-10-CM | POA: Insufficient documentation

## 2016-04-06 DIAGNOSIS — M25571 Pain in right ankle and joints of right foot: Secondary | ICD-10-CM

## 2016-04-06 DIAGNOSIS — T847XXS Infection and inflammatory reaction due to other internal orthopedic prosthetic devices, implants and grafts, sequela: Secondary | ICD-10-CM

## 2016-04-06 MED ORDER — DOXYCYCLINE HYCLATE 100 MG PO TABS: 100.0000 mg | ORAL_TABLET | Freq: Two times a day (BID) | ORAL | 0 refills | Status: DC
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
## 2016-04-09 ENCOUNTER — Other Ambulatory Visit (INDEPENDENT_AMBULATORY_CARE_PROVIDER_SITE_OTHER): Payer: Self-pay | Admitting: Family

## 2016-04-10 ENCOUNTER — Encounter (HOSPITAL_COMMUNITY): Payer: Self-pay | Admitting: *Deleted

## 2016-04-11 ENCOUNTER — Inpatient Hospital Stay (HOSPITAL_COMMUNITY): Payer: Medicaid Other | Admitting: Anesthesiology

## 2016-04-11 ENCOUNTER — Ambulatory Visit (HOSPITAL_COMMUNITY)
Admission: RE | Admit: 2016-04-11 | Discharge: 2016-04-12 | Disposition: A | Discharge status: Home / Self Care | Payer: Medicaid Other | Source: Ambulatory Visit | Attending: Orthopedic Surgery | Admitting: Orthopedic Surgery

## 2016-04-11 ENCOUNTER — Encounter (HOSPITAL_COMMUNITY)
Admission: RE | Disposition: A | Discharge status: Home / Self Care | Payer: Self-pay | Source: Ambulatory Visit | Attending: Orthopedic Surgery

## 2016-04-11 ENCOUNTER — Encounter (HOSPITAL_COMMUNITY): Payer: Self-pay | Admitting: Surgery

## 2016-04-11 DIAGNOSIS — Y838 Other surgical procedures as the cause of abnormal reaction of the patient, or of later complication, without mention of misadventure at the time of the procedure: Secondary | ICD-10-CM | POA: Diagnosis not present

## 2016-04-11 DIAGNOSIS — T847XXA Infection and inflammatory reaction due to other internal orthopedic prosthetic devices, implants and grafts, initial encounter: Secondary | ICD-10-CM | POA: Diagnosis present

## 2016-04-11 DIAGNOSIS — F17210 Nicotine dependence, cigarettes, uncomplicated: Secondary | ICD-10-CM | POA: Insufficient documentation

## 2016-04-11 DIAGNOSIS — T8469XA Infection and inflammatory reaction due to internal fixation device of other site, initial encounter: Principal | ICD-10-CM | POA: Insufficient documentation

## 2016-04-11 DIAGNOSIS — R262 Difficulty in walking, not elsewhere classified: Secondary | ICD-10-CM

## 2016-04-11 DIAGNOSIS — T847XXS Infection and inflammatory reaction due to other internal orthopedic prosthetic devices, implants and grafts, sequela: Secondary | ICD-10-CM

## 2016-04-11 HISTORY — PX: HARDWARE REMOVAL: SHX979

## 2016-04-11 LAB — CBC
HEMATOCRIT: 45.4 % (ref 39.0–52.0)
HEMOGLOBIN: 15.3 g/dL (ref 13.0–17.0)
MCH: 28.8 pg (ref 26.0–34.0)
MCHC: 33.7 g/dL (ref 30.0–36.0)
MCV: 85.5 fL (ref 78.0–100.0)
Platelets: 322 10*3/uL (ref 150–400)
RBC: 5.31 MIL/uL (ref 4.22–5.81)
RDW: 15.3 % (ref 11.5–15.5)
WBC: 9.4 10*3/uL (ref 4.0–10.5)

## 2016-04-11 LAB — SURGICAL PCR SCREEN
MRSA, PCR: NEGATIVE
Staphylococcus aureus: NEGATIVE

## 2016-04-11 SURGERY — REMOVAL, HARDWARE
Anesthesia: General | Site: Ankle | Laterality: Right

## 2016-04-11 MED ORDER — ONDANSETRON HCL 4 MG/2ML IJ SOLN: 4.0000 mg | Freq: Four times a day (QID) | INTRAMUSCULAR | Status: DC | PRN

## 2016-04-11 MED ORDER — 0.9 % SODIUM CHLORIDE (POUR BTL) OPTIME
TOPICAL | Status: DC | PRN
  Administered 2016-04-11: 1000 mL

## 2016-04-11 MED ORDER — CHLORHEXIDINE GLUCONATE 4 % EX LIQD: 60.0000 mL | Freq: Once | CUTANEOUS | Status: DC

## 2016-04-11 MED ORDER — HYDROMORPHONE HCL 1 MG/ML IJ SOLN
INTRAMUSCULAR | Status: AC
Start: 2016-04-11 — End: 2016-04-11
  Administered 2016-04-11: 0.5 mg via INTRAVENOUS
  Filled 2016-04-11: qty 0.5

## 2016-04-11 MED ORDER — ACETAMINOPHEN 650 MG RE SUPP: 650.0000 mg | Freq: Four times a day (QID) | RECTAL | Status: DC | PRN

## 2016-04-11 MED ORDER — DEXTROSE 5 % IV SOLN
500.0000 mg | Freq: Four times a day (QID) | INTRAVENOUS | Status: DC | PRN
  Filled 2016-04-11: qty 5

## 2016-04-11 MED ORDER — ONDANSETRON HCL 4 MG/2ML IJ SOLN
INTRAMUSCULAR | Status: DC | PRN
  Administered 2016-04-11: 4 mg via INTRAVENOUS

## 2016-04-11 MED ORDER — FENTANYL CITRATE (PF) 100 MCG/2ML IJ SOLN
INTRAMUSCULAR | Status: AC
  Filled 2016-04-11: qty 2

## 2016-04-11 MED ORDER — HYDROMORPHONE HCL 1 MG/ML IJ SOLN
0.2500 mg | INTRAMUSCULAR | Status: DC | PRN
  Administered 2016-04-11 (×4): 0.5 mg via INTRAVENOUS

## 2016-04-11 MED ORDER — METOCLOPRAMIDE HCL 5 MG PO TABS: 5.0000 mg | ORAL_TABLET | Freq: Three times a day (TID) | ORAL | Status: DC | PRN

## 2016-04-11 MED ORDER — MUPIROCIN 2 % EX OINT
TOPICAL_OINTMENT | CUTANEOUS | Status: AC
  Administered 2016-04-11: 12:00:00
  Filled 2016-04-11: qty 22

## 2016-04-11 MED ORDER — ACETAMINOPHEN 325 MG PO TABS
ORAL_TABLET | ORAL | Status: AC
  Administered 2016-04-11: 650 mg via ORAL
  Filled 2016-04-11: qty 2

## 2016-04-11 MED ORDER — KETOROLAC TROMETHAMINE 15 MG/ML IJ SOLN
INTRAMUSCULAR | Status: AC
  Filled 2016-04-11: qty 1

## 2016-04-11 MED ORDER — OXYCODONE HCL 5 MG PO TABS
5.0000 mg | ORAL_TABLET | ORAL | Status: DC | PRN
  Administered 2016-04-11 – 2016-04-12 (×3): 10 mg via ORAL
  Filled 2016-04-11 (×2): qty 2

## 2016-04-11 MED ORDER — ACETAMINOPHEN 325 MG PO TABS
650.0000 mg | ORAL_TABLET | Freq: Four times a day (QID) | ORAL | Status: DC | PRN
  Administered 2016-04-11: 650 mg via ORAL

## 2016-04-11 MED ORDER — FENTANYL CITRATE (PF) 100 MCG/2ML IJ SOLN
INTRAMUSCULAR | Status: DC | PRN
  Administered 2016-04-11 (×2): 100 ug via INTRAVENOUS

## 2016-04-11 MED ORDER — MIDAZOLAM HCL 5 MG/5ML IJ SOLN
INTRAMUSCULAR | Status: DC | PRN
  Administered 2016-04-11: 2 mg via INTRAVENOUS

## 2016-04-11 MED ORDER — METHOCARBAMOL 500 MG PO TABS
ORAL_TABLET | ORAL | Status: AC
  Administered 2016-04-11: 500 mg via ORAL
  Filled 2016-04-11: qty 1

## 2016-04-11 MED ORDER — CEFAZOLIN SODIUM-DEXTROSE 2-4 GM/100ML-% IV SOLN
2.0000 g | INTRAVENOUS | Status: AC
  Administered 2016-04-11: 2 g via INTRAVENOUS
  Filled 2016-04-11: qty 100

## 2016-04-11 MED ORDER — LIDOCAINE 2% (20 MG/ML) 5 ML SYRINGE
INTRAMUSCULAR | Status: AC
  Filled 2016-04-11: qty 5

## 2016-04-11 MED ORDER — HYDROMORPHONE HCL 1 MG/ML IJ SOLN
INTRAMUSCULAR | Status: AC
  Administered 2016-04-11: 0.5 mg via INTRAVENOUS
  Filled 2016-04-11: qty 0.5

## 2016-04-11 MED ORDER — PROPOFOL 10 MG/ML IV BOLUS
INTRAVENOUS | Status: DC | PRN
  Administered 2016-04-11: 50 mg via INTRAVENOUS
  Administered 2016-04-11: 150 mg via INTRAVENOUS

## 2016-04-11 MED ORDER — PROPOFOL 10 MG/ML IV BOLUS
INTRAVENOUS | Status: AC
  Filled 2016-04-11: qty 20

## 2016-04-11 MED ORDER — CEFAZOLIN IN D5W 1 GM/50ML IV SOLN
1.0000 g | Freq: Four times a day (QID) | INTRAVENOUS | Status: AC
  Administered 2016-04-11 – 2016-04-12 (×3): 1 g via INTRAVENOUS
  Filled 2016-04-11 (×3): qty 50

## 2016-04-11 MED ORDER — OXYCODONE HCL 5 MG PO TABS
ORAL_TABLET | ORAL | Status: AC
  Administered 2016-04-11: 10 mg via ORAL
  Filled 2016-04-11: qty 2

## 2016-04-11 MED ORDER — KETOROLAC TROMETHAMINE 15 MG/ML IJ SOLN
15.0000 mg | Freq: Four times a day (QID) | INTRAMUSCULAR | Status: AC
Start: 2016-04-11 — End: 2016-04-12
  Administered 2016-04-11 – 2016-04-12 (×4): 15 mg via INTRAVENOUS
  Filled 2016-04-11 (×3): qty 1

## 2016-04-11 MED ORDER — METHOCARBAMOL 500 MG PO TABS
500.0000 mg | ORAL_TABLET | Freq: Four times a day (QID) | ORAL | Status: DC | PRN
  Administered 2016-04-11 (×2): 500 mg via ORAL
  Filled 2016-04-11: qty 1

## 2016-04-11 MED ORDER — MIDAZOLAM HCL 2 MG/2ML IJ SOLN
INTRAMUSCULAR | Status: AC
  Filled 2016-04-11: qty 2

## 2016-04-11 MED ORDER — VANCOMYCIN HCL 500 MG IV SOLR
INTRAVENOUS | Status: AC
  Filled 2016-04-11: qty 500

## 2016-04-11 MED ORDER — GENTAMICIN SULFATE 40 MG/ML IJ SOLN
INTRAMUSCULAR | Status: DC | PRN
  Administered 2016-04-11: 160 mg

## 2016-04-11 MED ORDER — HYDROMORPHONE HCL 2 MG/ML IJ SOLN: 1.0000 mg | INTRAMUSCULAR | Status: DC | PRN

## 2016-04-11 MED ORDER — ASPIRIN EC 325 MG PO TBEC
325.0000 mg | DELAYED_RELEASE_TABLET | Freq: Every day | ORAL | Status: DC
  Administered 2016-04-11 – 2016-04-12 (×2): 325 mg via ORAL
  Filled 2016-04-11 (×2): qty 1

## 2016-04-11 MED ORDER — METOCLOPRAMIDE HCL 5 MG/ML IJ SOLN: 5.0000 mg | Freq: Three times a day (TID) | INTRAMUSCULAR | Status: DC | PRN

## 2016-04-11 MED ORDER — SODIUM CHLORIDE 0.9 % IV SOLN
INTRAVENOUS | Status: DC
  Administered 2016-04-11: 18:00:00 via INTRAVENOUS

## 2016-04-11 MED ORDER — LACTATED RINGERS IV SOLN
INTRAVENOUS | Status: DC
  Administered 2016-04-11 (×2): via INTRAVENOUS

## 2016-04-11 MED ORDER — HYDROMORPHONE HCL 2 MG/ML IJ SOLN
INTRAMUSCULAR | Status: AC
  Filled 2016-04-11: qty 1

## 2016-04-11 MED ORDER — ONDANSETRON HCL 4 MG PO TABS: 4.0000 mg | ORAL_TABLET | Freq: Four times a day (QID) | ORAL | Status: DC | PRN

## 2016-04-11 MED ORDER — VANCOMYCIN HCL 500 MG IV SOLR
INTRAVENOUS | Status: DC | PRN
  Administered 2016-04-11: 500 mg

## 2016-04-11 MED ORDER — GENTAMICIN SULFATE 40 MG/ML IJ SOLN
INTRAMUSCULAR | Status: AC
  Filled 2016-04-11: qty 4

## 2016-04-11 MED ORDER — LIDOCAINE 2% (20 MG/ML) 5 ML SYRINGE
INTRAMUSCULAR | Status: DC | PRN
  Administered 2016-04-11: 50 mg via INTRAVENOUS

## 2016-04-11 SURGICAL SUPPLY — 44 items
BANDAGE ESMARK 6X9 LF (GAUZE/BANDAGES/DRESSINGS) IMPLANT
BNDG COHESIVE 4X5 TAN STRL (GAUZE/BANDAGES/DRESSINGS) IMPLANT
BNDG ESMARK 6X9 LF (GAUZE/BANDAGES/DRESSINGS)
BNDG GAUZE ELAST 4 BULKY (GAUZE/BANDAGES/DRESSINGS) ×3 IMPLANT
CANISTER WOUND CARE 500ML ATS (WOUND CARE) ×3 IMPLANT
COVER SURGICAL LIGHT HANDLE (MISCELLANEOUS) ×6 IMPLANT
CUFF TOURNIQUET SINGLE 34IN LL (TOURNIQUET CUFF) IMPLANT
CUFF TOURNIQUET SINGLE 44IN (TOURNIQUET CUFF) IMPLANT
DRAPE C-ARM 42X72 X-RAY (DRAPES) IMPLANT
DRAPE INCISE IOBAN 66X45 STRL (DRAPES) ×3 IMPLANT
DRAPE ORTHO SPLIT 77X108 STRL (DRAPES)
DRAPE SURG ORHT 6 SPLT 77X108 (DRAPES) IMPLANT
DRAPE U-SHAPE 47X51 STRL (DRAPES) ×3 IMPLANT
DRSG EMULSION OIL 3X3 NADH (GAUZE/BANDAGES/DRESSINGS) ×3 IMPLANT
DRSG PAD ABDOMINAL 8X10 ST (GAUZE/BANDAGES/DRESSINGS) ×3 IMPLANT
DURAPREP 26ML APPLICATOR (WOUND CARE) ×3 IMPLANT
ELECT REM PT RETURN 9FT ADLT (ELECTROSURGICAL) ×3
ELECTRODE REM PT RTRN 9FT ADLT (ELECTROSURGICAL) ×1 IMPLANT
GAUZE SPONGE 4X4 12PLY STRL (GAUZE/BANDAGES/DRESSINGS) ×3 IMPLANT
GLOVE BIOGEL PI IND STRL 9 (GLOVE) ×1 IMPLANT
GLOVE BIOGEL PI INDICATOR 9 (GLOVE) ×2
GLOVE SURG ORTHO 9.0 STRL STRW (GLOVE) ×3 IMPLANT
GOWN STRL REUS W/ TWL XL LVL3 (GOWN DISPOSABLE) ×3 IMPLANT
GOWN STRL REUS W/TWL XL LVL3 (GOWN DISPOSABLE) ×6
KIT BASIN OR (CUSTOM PROCEDURE TRAY) ×3 IMPLANT
KIT ROOM TURNOVER OR (KITS) ×3 IMPLANT
KIT STIMULAN RAPID CURE 5CC (Orthopedic Implant) ×3 IMPLANT
MANIFOLD NEPTUNE II (INSTRUMENTS) ×3 IMPLANT
NS IRRIG 1000ML POUR BTL (IV SOLUTION) ×3 IMPLANT
PACK ORTHO EXTREMITY (CUSTOM PROCEDURE TRAY) ×3 IMPLANT
PAD ARMBOARD 7.5X6 YLW CONV (MISCELLANEOUS) ×6 IMPLANT
PREVENA INCISION MGT 90 150 (MISCELLANEOUS) ×3 IMPLANT
SPONGE LAP 18X18 X RAY DECT (DISPOSABLE) IMPLANT
STAPLER VISISTAT 35W (STAPLE) IMPLANT
STOCKINETTE IMPERVIOUS 9X36 MD (GAUZE/BANDAGES/DRESSINGS) IMPLANT
SUT ETHILON 2 0 PSLX (SUTURE) IMPLANT
SUT VIC AB 0 CT1 27 (SUTURE)
SUT VIC AB 0 CT1 27XBRD ANBCTR (SUTURE) IMPLANT
SUT VIC AB 2-0 CT1 27 (SUTURE)
SUT VIC AB 2-0 CT1 TAPERPNT 27 (SUTURE) IMPLANT
TOWEL OR 17X24 6PK STRL BLUE (TOWEL DISPOSABLE) ×3 IMPLANT
TOWEL OR 17X26 10 PK STRL BLUE (TOWEL DISPOSABLE) ×3 IMPLANT
UNDERPAD 30X30 (UNDERPADS AND DIAPERS) ×3 IMPLANT
WATER STERILE IRR 1000ML POUR (IV SOLUTION) ×3 IMPLANT

## 2016-04-11 NOTE — Op Note (Signed)
04/11/2016  1:16 PM  PATIENT:  Lance Rivera    PRE-OPERATIVE DIAGNOSIS:  Infected Hardware Right Ankle  POST-OPERATIVE DIAGNOSIS:  Same  PROCEDURE:  Removal Hardware Right Ankle,  Place Antibiotic Beads,  Wound VAC Local tissue rearrangement for wound closure 3 x 7 cm  SURGEON:  Nadara MustardMarcus V Duda, MD  PHYSICIAN ASSISTANT:None ANESTHESIA:   General  PREOPERATIVE INDICATIONS:  Lance Rivera is a  41 y.o. male with a diagnosis of Infected Hardware Right Ankle who failed conservative measures and elected for surgical management.    The risks benefits and alternatives were discussed with the patient preoperatively including but not limited to the risks of infection, bleeding, nerve injury, cardiopulmonary complications, the need for revision surgery, among others, and the patient was willing to proceed.  OPERATIVE IMPLANTS: Antibiotic beads with 5 mL stimulant with 500 mg vancomycin and 160 mg gentamicin. Incisional wound VAC.  OPERATIVE FINDINGS: No purulence the hardware was loose.  OPERATIVE PROCEDURE: Patient was brought to the operating room and underwent a general anesthetic. After adequate levels anesthesia obtained patient's right lower extremity was prepped using DuraPrep draped into a sterile field a timeout was called. A lateral incision was made and the 2 ulcerative areas were ellipsed out with the incision. This was carried sharply down to the hardware. The hardware with and screws were removed without complications. The wound was irrigated with normal saline. Patient still had a fibrous delayed union of the fracture site with large bony defect. A Ronjair and periosteal elevator was used to debride and remove skin soft tissue and fibrinous tissue. The bone was healthy viable and bleeding. After irrigation with normal saline antibiotic beads were placed in a pouch posteriorly to the fibula. Local tissue rearrangement was used for wound closure 3 x 7 cm. An incisional wound VAC was applied  this had a good suction fit patient was extubated taken to PACU in stable condition.  Plan for overnight observation and discharged to home in the morning.

## 2016-04-11 NOTE — H&P (Signed)
Lance Rivera is an 41 y.o. male.   Chief Complaint: Infected hardware right ankle HPI: Patient is a 41 year old gentleman status post open right ankle fracture. Patient underwent open reduction internal fixation. Postoperatively patient did have some small drainage she was started on antibiotics both topically and orally. Patient continued to smoke during his postoperative course had persistent drainage and presents at this time for removal the deep retained hardware. Patient did have a segmental defect and hardware was left in place as long as possible to maintain the alignment of his ankle.  Past Medical History:  Diagnosis Date  . ADD (attention deficit disorder)    since Age 89  . Asthma    as a child- not a problem    Past Surgical History:  Procedure Laterality Date  . ORIF ANKLE FRACTURE Right 12/20/2015   Procedure: OPEN REDUCTION INTERNAL FIXATION (ORIF) OPEN ANKLE FRACTURE;  Surgeon: Nadara MustardMarcus V Marrie Chandra, MD;  Location: [facility] OR;  Service: Orthopedics;  Laterality: Right;  . SACRO-ILIAC PINNING Left 12/20/2015   Procedure: Loyal GamblerSACRO-ILIAC PINNING;  Surgeon: Myrene GalasMichael Handy, MD;  [facility];  Service: Orthopedics;  Laterality: Left;    Family History  Problem Relation Age of Onset  . COPD Mother   . Hypertension Mother   . Cancer Father   . Early death Father    Social History:  reports that he has been smoking.  He has a 11.50 pack-year smoking history. He has quit using smokeless tobacco. He reports that he does not drink alcohol or use drugs.  Allergies:  Allergies  Allergen Reactions  . Pollen Extract Other (See Comments)    Trees, grass, pets    No prescriptions prior to admission.    No results found for this or any previous visit (from the past 48 hour(s)). No results found.  Review of Systems  All other systems reviewed and are negative.   There were no vitals taken for this visit. Physical Exam  Examination patient is alert oriented no adenopathy well-dressed normal  affect normal history effort he and legs and regular shoewear. He has a small pinpoint draining wound over the lateral ankle hardware. There is no cellulitis. Radiographs shows stable internal fixation with no loss of reduction. Assessment/Plan Assessment: Infected deep retained hardware right ankle.  Plan: We'll plan for removal deep retained hardware, debridement of skin soft tissue and bone, placement of antibiotic beads, placement of a wound VAC. Again the importance of smoking cessation was discussed discussed the potential for amputation with ankle infection.  Nadara MustardMarcus V Ibraheem Voris, MD 04/11/2016, 7:30 AM

## 2016-04-11 NOTE — Transfer of Care (Signed)
Immediate Anesthesia Transfer of Care Note  Patient: Lance SabalDavid Mehler  Procedure(s) Performed: Procedure(s): Removal Hardware Right Ankle, Place Antibiotic Beads, Wound VAC (Right)  Patient Location: PACU  Anesthesia Type:General  Level of Consciousness: awake, alert , oriented and patient cooperative  Airway & Oxygen Therapy: Patient Spontanous Breathing and Patient connected to nasal cannula oxygen  Post-op Assessment: Report given to RN, Post -op Vital signs reviewed and stable and Patient moving all extremities  Post vital signs: Reviewed and stable  Last Vitals:  Vitals:   04/11/16 1111 04/11/16 1317  BP: 121/80   Pulse: 83   Resp: 20   Temp: 36.8 C (!) 36.1 C    Last Pain:  Vitals:   04/11/16 1127  TempSrc:   PainSc: 2       Patients Stated Pain Goal: 4 (04/11/16 1127)  Complications: No apparent anesthesia complications

## 2016-04-11 NOTE — Anesthesia Postprocedure Evaluation (Signed)
Anesthesia Post Note  Patient: Lance Rivera  Procedure(s) Performed: Procedure(s) (LRB): Removal Hardware Right Ankle, Place Antibiotic Beads, Wound VAC (Right)  Patient location during evaluation: PACU Anesthesia Type: General Level of consciousness: awake and alert Pain management: pain level controlled Vital Signs Assessment: post-procedure vital signs reviewed and stable Respiratory status: spontaneous breathing, nonlabored ventilation and respiratory function stable Cardiovascular status: blood pressure returned to baseline and stable Postop Assessment: no signs of nausea or vomiting Anesthetic complications: no       Last Vitals:  Vitals:   04/11/16 1330 04/11/16 1345  BP: (!) 125/95 115/89  Pulse: 75 76  Resp: 16 16  Temp:      Last Pain:  Vitals:   04/11/16 1345  TempSrc:   PainSc: 8                  Yasha Tibbett,W. EDMOND

## 2016-04-11 NOTE — Anesthesia Preprocedure Evaluation (Addendum)
Anesthesia Evaluation  Patient identified by MRN, date of birth, ID band Patient awake    Reviewed: Allergy & Precautions, H&P , NPO status , Patient's Chart, lab work & pertinent test results  Airway Mallampati: III  TM Distance: >3 FB Neck ROM: Full    Dental no notable dental hx. (+) Poor Dentition, Dental Advisory Given   Pulmonary asthma , Current Smoker,    Pulmonary exam normal breath sounds clear to auscultation       Cardiovascular negative cardio ROS   Rhythm:Regular Rate:Normal     Neuro/Psych negative neurological ROS  negative psych ROS   GI/Hepatic negative GI ROS, Neg liver ROS,   Endo/Other  negative endocrine ROS  Renal/GU negative Renal ROS  negative genitourinary   Musculoskeletal   Abdominal   Peds  Hematology negative hematology ROS (+)   Anesthesia Other Findings   Reproductive/Obstetrics negative OB ROS                            Anesthesia Physical Anesthesia Plan  ASA: II  Anesthesia Plan: General   Post-op Pain Management:    Induction: Intravenous  Airway Management Planned: LMA  Additional Equipment:   Intra-op Plan:   Post-operative Plan: Extubation in OR  Informed Consent: I have reviewed the patients History and Physical, chart, labs and discussed the procedure including the risks, benefits and alternatives for the proposed anesthesia with the patient or authorized representative who has indicated his/her understanding and acceptance.   Dental advisory given  Plan Discussed with: CRNA  Anesthesia Plan Comments:         Anesthesia Quick Evaluation

## 2016-04-11 NOTE — Anesthesia Procedure Notes (Signed)
Procedure Name: LMA Insertion Date/Time: 04/11/2016 12:41 PM Performed by: Orlinda BlalockMCMILLEN, MICHAEL L Pre-anesthesia Checklist: Patient identified, Emergency Drugs available, Suction available and Patient being monitored Patient Re-evaluated:Patient Re-evaluated prior to inductionOxygen Delivery Method: Circle system utilized Preoxygenation: Pre-oxygenation with 100% oxygen Intubation Type: IV induction LMA: LMA inserted LMA Size: 5.0 Tube type: Oral Number of attempts: 1 Placement Confirmation: positive ETCO2 and breath sounds checked- equal and bilateral Tube secured with: Tape Dental Injury: Teeth and Oropharynx as per pre-operative assessment

## 2016-04-12 ENCOUNTER — Encounter (HOSPITAL_COMMUNITY): Payer: Self-pay | Admitting: Orthopedic Surgery

## 2016-04-12 DIAGNOSIS — T8469XA Infection and inflammatory reaction due to internal fixation device of other site, initial encounter: Secondary | ICD-10-CM | POA: Diagnosis not present

## 2016-04-12 MED ORDER — OXYCODONE-ACETAMINOPHEN 5-325 MG PO TABS: 1.0000 | ORAL_TABLET | ORAL | 0 refills | Status: AC | PRN

## 2016-04-12 NOTE — Discharge Instructions (Signed)
Cellulitis, Adult Cellulitis is a skin infection. The infected area is usually red and tender. This condition occurs most often in the arms and lower legs. The infection can travel to the muscles, blood, and underlying tissue and become serious. It is very important to get treated for this condition. What are the causes? Cellulitis is caused by bacteria. The bacteria enter through a break in the skin, such as a cut, burn, insect bite, open sore, or crack. What increases the risk? This condition is more likely to occur in people who:  Have a weak defense system (immune system).  Have open wounds on the skin such as cuts, Hafer, bites, and scrapes. Bacteria can enter the body through these open wounds.  Are older.  Have diabetes.  Have a type of long-lasting (chronic) liver disease (cirrhosis) or kidney disease.  Use IV drugs. What are the signs or symptoms? Symptoms of this condition include:  Redness, streaking, or spotting on the skin.  Swollen area of the skin.  Tenderness or pain when an area of the skin is touched.  Warm skin.  Fever.  Chills.  Blisters. How is this diagnosed? This condition is diagnosed based on a medical history and physical exam. You may also have tests, including:  Blood tests.  Lab tests.  Imaging tests. How is this treated? Treatment for this condition may include:  Medicines, such as antibiotic medicines or antihistamines.  Supportive care, such as rest and application of cold or warm cloths (cold or warm compresses) to the skin.  Hospital care, if the condition is severe. The infection usually gets better within 1-2 days of treatment. Follow these instructions at home:  Take over-the-counter and prescription medicines only as told by your health care provider.  If you were prescribed an antibiotic medicine, take it as told by your health care provider. Do not stop taking the antibiotic even if you start to feel better.  Drink  enough fluid to keep your urine clear or pale yellow.  Do not touch or rub the infected area.  Raise (elevate) the infected area above the level of your heart while you are sitting or lying down.  Apply warm or cold compresses to the affected area as told by your health care provider.  Keep all follow-up visits as told by your health care provider. This is important. These visits let your health care provider make sure a more serious infection is not developing. Contact a health care provider if:  You have a fever.  Your symptoms do not improve within 1-2 days of starting treatment.  Your bone or joint underneath the infected area becomes painful after the skin has healed.  Your infection returns in the same area or another area.  You notice a swollen bump in the infected area.  You develop new symptoms.  You have a general ill feeling (malaise) with muscle aches and pains. Get help right away if:  Your symptoms get worse.  You feel very sleepy.  You develop vomiting or diarrhea that persists.  You notice red streaks coming from the infected area.  Your red area gets larger or turns dark in color. This information is not intended to replace advice given to you by your health care provider. Make sure you discuss any questions you have with your health care provider. Document Released: 12/27/2004 Document Revised: 07/28/2015 Document Reviewed: 01/26/2015 Elsevier Interactive Patient Education  2017 [employer] Hardware Removal Introduction Hardware removal is a procedure to remove from the body any  medical parts that were used to repair a broken bone, such as pins, screws, rods, wires, and plates. This procedure may be done to:  Remove medical parts that are normally removed after a broken bone has healed.  Remove medical parts that are causing problems, such as infection or pain.  Remove medical parts that are not working.  Replace medical parts with newer, better  materials. Tell a health care provider about:  Any allergies you have.  All medicines you are taking, including vitamins, herbs, eye drops, creams, and over-the-counter medicines. This includes any use of steroids, either by mouth or in cream form.  Any problems you or family members have had with anesthetic medicines.  Any blood disorders you have.  Any surgeries you have had.  Any medical conditions you have.  Possibility of pregnancy, if this applies. What are the risks? Generally, this is a safe procedure. However, problems may occur, including:  Infection.  Bleeding.  Pain.  The bone breaking again (refracture).  A failure to completely remove the medical parts. What happens before the procedure?  Follow instructions from your health care provider about eating or drinking restrictions.  Ask your health care provider about:  Changing or stopping your regular medicines. This is especially important if you are taking diabetes medicines or blood thinners.  Taking medicines such as aspirin and ibuprofen. These medicines can thin your blood. Do not take these medicines before your procedure if your health care provider instructs you not to.  Plan to have someone take you home after the procedure.  If you go home right after the procedure, plan to have someone with you for 24 hours. What happens during the procedure?  You will lie on an exam table.  Several monitors will be connected to you to keep track of your heart rate, blood pressure, and breathing during the procedure.  An IV tube will be inserted into one of your veins.  You will be given one or more of the following:  A medicine that helps you relax (sedative).  A medicine that numbs the area (local anesthetic).  A medicine that makes you fall asleep (general anesthetic).  A medicine that is injected into your spine that numbs the area below and slightly above the injection site (spinal  anesthetic).  X-rays may be taken.  The surgeon will make an incision over the area where the medical parts are located.  The medical parts will be removed.  The incision will be closed with stitches (sutures), staples, or surgical glue.  A bandage (dressing) will be placed over the incision site to keep it clean and dry.  A splint, cast, or removable walking boot may be applied until the area heals. The procedure may vary among health care providers and hospitals. What happens after the procedure?  Your blood pressure, heart rate, breathing rate, and blood oxygen level will be monitored often until the medicines you were given have worn off.  You will stay in the recovery room until you are awake and able to drink fluids.  You may be sleepy and nauseous, and you may feel some pain. This information is not intended to replace advice given to you by your health care provider. Make sure you discuss any questions you have with your health care provider. Document Released: 01/14/2009 Document Revised: 08/25/2015 Document Reviewed: 03/15/2014  2017 Elsevier

## 2016-04-12 NOTE — Discharge Summary (Signed)
Discharge Diagnoses:  Active Problems:   Hardware complicating wound infection (HCC)   Surgeries: Procedure(s): Removal Hardware Right Ankle, Place Antibiotic Beads, Wound VAC on 04/11/2016    Consultants:  none  Discharged Condition: Improved  Hospital Course: Lance SabalDavid Rivera is an 41 y.o. male who was admitted 04/11/2016 with a chief complaint of chronic drainage lateral ankle on the right status post open reduction internal fixation for open fracture, with a final diagnosis of Infected Hardware Right Ankle.  Patient was brought to the operating room on 04/11/2016 and underwent Procedure(s): Removal Hardware Right Ankle, Place Antibiotic Beads, Wound VAC.    Patient was given perioperative antibiotics: Anti-infectives    Start     Dose/Rate Route Frequency Ordered Stop   04/11/16 1900  ceFAZolin (ANCEF) IVPB 1 g/50 mL premix     1 g 100 mL/hr over 30 Minutes Intravenous Every 6 hours 04/11/16 1747 04/12/16 0544   04/11/16 1330  ceFAZolin (ANCEF) IVPB 2g/100 mL premix     2 g 200 mL/hr over 30 Minutes Intravenous On call to O.R. 04/11/16 1115 04/11/16 1240   04/11/16 1248  vancomycin (VANCOCIN) powder  Status:  Discontinued       As needed 04/11/16 1248 04/11/16 1312   04/11/16 1247  gentamicin (GARAMYCIN) injection  Status:  Discontinued       As needed 04/11/16 1248 04/11/16 1312    .  Patient was given sequential compression devices, early ambulation, and aspirin for DVT prophylaxis.  Recent vital signs: Patient Vitals for the past 24 hrs:  BP Temp Temp src Pulse Resp SpO2  04/12/16 0609 120/68 98 F (36.7 C) Oral 79 20 94 %  04/12/16 0055 115/72 98 F (36.7 C) Oral 76 (!) 25 94 %  04/11/16 2149 110/76 98.3 F (36.8 C) Oral 80 - 94 %  04/11/16 1718 129/81 98 F (36.7 C) - 84 - 98 %  04/11/16 1645 116/76 - - 88 (!) 31 96 %  04/11/16 1630 111/75 - - 64 10 95 %  04/11/16 1615 113/81 98 F (36.7 C) - 89 14 97 %  04/11/16 1600 115/73 - - 74 14 95 %  04/11/16 1545 119/84 - -  93 (!) 25 97 %  04/11/16 1530 120/79 - - 73 10 97 %  04/11/16 1515 111/83 - - 65 10 97 %  04/11/16 1500 117/79 - - 84 16 97 %  04/11/16 1445 - - - 80 17 95 %  04/11/16 1430 124/80 - - 78 13 95 %  04/11/16 1415 113/83 - - 71 14 94 %  04/11/16 1400 - - - 64 15 100 %  04/11/16 1345 115/89 - - 76 16 100 %  04/11/16 1330 (!) 125/95 97.9 F (36.6 C) - 75 16 100 %  04/11/16 1317 (!) 122/91 (!) 96.9 F (36.1 C) - 88 14 100 %  04/11/16 1111 121/80 98.2 F (36.8 C) Oral 83 20 100 %  .  Recent laboratory studies: No results found.  Discharge Medications:   Allergies as of 04/12/2016      Reactions   Pollen Extract Other (See Comments)   Trees, grass, pets      Medication List    TAKE these medications   acetaminophen 325 MG tablet Commonly known as:  TYLENOL Take 650 mg by mouth 2 (two) times daily as needed for mild pain.   doxycycline 100 MG tablet Commonly known as:  VIBRA-TABS Take 1 tablet (100 mg total) by mouth 2 (two) times daily.  multivitamin tablet Take 1 tablet by mouth daily.   mupirocin ointment 2 % Commonly known as:  BACTROBAN Apply 1 application topically 2 (two) times daily. Apply to the affected area 2 times a day   oxyCODONE-acetaminophen 5-325 MG tablet Commonly known as:  ROXICET Take 1 tablet by mouth every 4 (four) hours as needed for severe pain.   traMADol 50 MG tablet Commonly known as:  ULTRAM Take 1 tablet (50 mg total) by mouth every 6 (six) hours as needed.       Diagnostic Studies: Xr Ankle 2 Views Right  Result Date: 04/06/2016 Two-view radiographs of the right ankle shows healing of the medial malleolus fracture the lateral malleolus shows loosening of the hardware with a large bony defect from the initial injury.  Xr Ankle Complete Right  Result Date: 03/23/2016 X-rays of the ankle are obtained. Patient had previous open ankle fracture ORIF. There remains a gap in the fibula and syndesmosis appears widened on current x-rays.  Impression: Delayed healing right lateral malleolus with syndesmotic widening   Patient benefited maximally from their hospital stay and there were no complications.     Disposition: 06-Home-Health Care Svc Discharge Instructions    Apply cam walker    Complete by:  As directed    Laterality:  Right   Call MD / Call 911    Complete by:  As directed    If you experience chest pain or shortness of breath, CALL 911 and be transported to the hospital emergency room.  If you develope a fever above 101 F, pus (white drainage) or increased drainage or redness at the wound, or calf pain, call your surgeon's office.   Constipation Prevention    Complete by:  As directed    Drink plenty of fluids.  Prune juice may be helpful.  You may use a stool softener, such as Colace (over the counter) 100 mg twice a day.  Use MiraLax (over the counter) for constipation as needed.   Diet - low sodium heart healthy    Complete by:  As directed    Increase activity slowly as tolerated    Complete by:  As directed    Non weight bearing    Complete by:  As directed    Laterality:  right   Extremity:  Lower     Follow-up Information    [name], MD Follow up in 2 week(s).   Specialty:  Orthopedic Surgery Contact information: [address] [place] [address] [phone]            Signed: [name] 04/12/2016, 7:35 AM

## 2016-04-12 NOTE — Progress Notes (Signed)
Orthopedic Tech Progress Note Patient Details:  Lance SabalDavid Caywood [birth year] [id number]  Ortho Devices Type of Ortho Device: CAM walker Ortho Device/Splint Location: rle Ortho Device/Splint Interventions: Application   Kyrian Stage 04/12/2016, 8:11 AM
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
## 2016-04-12 NOTE — Progress Notes (Signed)
Orthopedic Tech Progress Note Patient Details:  Lance Rivera [birth year] [id number]  Ortho Devices Type of Ortho Device: Crutches Ortho Device/Splint Location: Provided and adjusted Crutches for pt.  We verified Physical/Occupational Therapy has trained pt to use crutches. Pt was informed to not use crutches unless staff is assisting while he is still admitted.  Ortho Device/Splint Interventions: Adjustment   Alvina ChouWilliams, Shunte Senseney C 04/12/2016, 1:10 PM

## 2016-04-12 NOTE — Care Management (Signed)
Patient has no Home Health needs, will need crutches. RN has ordered from [facility]. Nolon NationsSuasn Angelica Wix, RN BSN Case Manager [phone]

## 2016-04-25 ENCOUNTER — Encounter (INDEPENDENT_AMBULATORY_CARE_PROVIDER_SITE_OTHER): Payer: Self-pay | Admitting: Orthopedic Surgery

## 2016-04-25 ENCOUNTER — Ambulatory Visit (INDEPENDENT_AMBULATORY_CARE_PROVIDER_SITE_OTHER): Payer: Medicaid Other | Admitting: Orthopedic Surgery

## 2016-04-25 DIAGNOSIS — S82891H Other fracture of right lower leg, subsequent encounter for open fracture type I or II with delayed healing: Secondary | ICD-10-CM
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
## 2016-04-30 ENCOUNTER — Encounter (INDEPENDENT_AMBULATORY_CARE_PROVIDER_SITE_OTHER): Payer: Self-pay | Admitting: Orthopaedic Surgery

## 2016-04-30 ENCOUNTER — Inpatient Hospital Stay: Admission: AD | Admit: 2016-04-30 | Payer: Medicaid Other | Source: Ambulatory Visit | Admitting: Orthopaedic Surgery

## 2016-04-30 ENCOUNTER — Ambulatory Visit (INDEPENDENT_AMBULATORY_CARE_PROVIDER_SITE_OTHER): Payer: Medicaid Other | Admitting: Orthopaedic Surgery

## 2016-04-30 ENCOUNTER — Telehealth (INDEPENDENT_AMBULATORY_CARE_PROVIDER_SITE_OTHER): Payer: Self-pay | Admitting: Orthopedic Surgery

## 2016-04-30 DIAGNOSIS — S82891H Other fracture of right lower leg, subsequent encounter for open fracture type I or II with delayed healing: Secondary | ICD-10-CM

## 2016-04-30 NOTE — Telephone Encounter (Signed)
Pt called to let us know pt is not going to be admitted to [facility], that he is doing something outpt instead?  [phone]
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
## 2016-05-02 NOTE — Telephone Encounter (Signed)
Both Dr. Roda ShuttersXu and Dr. Lajoyce Cornersuda are aware.

## 2016-05-03 ENCOUNTER — Ambulatory Visit (INDEPENDENT_AMBULATORY_CARE_PROVIDER_SITE_OTHER): Payer: Medicaid Other | Admitting: Orthopedic Surgery

## 2016-05-07 ENCOUNTER — Telehealth (INDEPENDENT_AMBULATORY_CARE_PROVIDER_SITE_OTHER): Payer: Self-pay | Admitting: Orthopedic Surgery

## 2016-05-07 ENCOUNTER — Ambulatory Visit (INDEPENDENT_AMBULATORY_CARE_PROVIDER_SITE_OTHER): Payer: Medicaid Other | Admitting: Orthopedic Surgery

## 2016-05-07 ENCOUNTER — Encounter (INDEPENDENT_AMBULATORY_CARE_PROVIDER_SITE_OTHER): Payer: Self-pay | Admitting: Orthopedic Surgery

## 2016-05-07 VITALS — Ht 72.0 in | Wt 211.0 lb

## 2016-05-07 DIAGNOSIS — T847XXS Infection and inflammatory reaction due to other internal orthopedic prosthetic devices, implants and grafts, sequela: Secondary | ICD-10-CM

## 2016-05-07 MED ORDER — DOXYCYCLINE HYCLATE 100 MG PO TABS: 100.0000 mg | ORAL_TABLET | Freq: Two times a day (BID) | ORAL | 0 refills | Status: AC

## 2016-05-07 NOTE — Telephone Encounter (Signed)
PATIENTS GIRLFRIEND, SARAH CALLED TO SEE IF THE RX FOR ANTIBIOTICS HAD BEEN SENT OFF TO THE [facility] IN [place]. CB # [phone]
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
## 2016-05-07 NOTE — Addendum Note (Signed)
Addended by: Donalee CitrinPEELE, Maxamus Colao L on: 05/07/2016 03:16 PM   Modules accepted: Orders

## 2016-05-08 NOTE — Telephone Encounter (Signed)
I called and left voicemail, rx for doxycycline was [facility] [place] and called in this morning to [facility] in [place].

## 2016-05-21 ENCOUNTER — Encounter (INDEPENDENT_AMBULATORY_CARE_PROVIDER_SITE_OTHER): Payer: Self-pay | Admitting: Orthopedic Surgery

## 2016-05-21 ENCOUNTER — Ambulatory Visit (INDEPENDENT_AMBULATORY_CARE_PROVIDER_SITE_OTHER): Payer: Medicaid Other | Admitting: Orthopedic Surgery

## 2016-05-21 DIAGNOSIS — T847XXS Infection and inflammatory reaction due to other internal orthopedic prosthetic devices, implants and grafts, sequela: Secondary | ICD-10-CM
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
## 2016-06-04 ENCOUNTER — Encounter (INDEPENDENT_AMBULATORY_CARE_PROVIDER_SITE_OTHER): Payer: Self-pay | Admitting: Orthopedic Surgery

## 2016-06-04 ENCOUNTER — Ambulatory Visit (INDEPENDENT_AMBULATORY_CARE_PROVIDER_SITE_OTHER): Payer: Medicaid Other | Admitting: Orthopedic Surgery

## 2016-06-04 VITALS — Ht 72.0 in | Wt 211.0 lb

## 2016-06-04 DIAGNOSIS — T847XXS Infection and inflammatory reaction due to other internal orthopedic prosthetic devices, implants and grafts, sequela: Secondary | ICD-10-CM
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
## 2016-06-25 ENCOUNTER — Ambulatory Visit (INDEPENDENT_AMBULATORY_CARE_PROVIDER_SITE_OTHER): Payer: Medicaid Other | Admitting: Orthopedic Surgery

## 2016-06-25 DIAGNOSIS — T847XXS Infection and inflammatory reaction due to other internal orthopedic prosthetic devices, implants and grafts, sequela: Secondary | ICD-10-CM
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
## 2017-01-10 ENCOUNTER — Telehealth (INDEPENDENT_AMBULATORY_CARE_PROVIDER_SITE_OTHER): Payer: Self-pay | Admitting: Orthopedic Surgery

## 2017-01-10 NOTE — Telephone Encounter (Signed)
Patient's girlfriend called wanting to know if Dr. Lajoyce Corners could do pain management for him or does he need a referral.  CB#[phone].  Thank you.

## 2017-01-11 NOTE — Telephone Encounter (Signed)
Left voicemail, chronic narcotics cannot be prescribed by Dr. Lajoyce Corners. We however have not seen patient since March of this year, if he is having issues with leg, to call and schedule office visit. If he is just seeking pain management referral to let us know.
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
